# Patient Record
Sex: Male | Born: 1985 | Race: White | Hispanic: No | Smoking: Former smoker
Health system: Southern US, Academic
[De-identification: ages and names within clinical notes are randomized; demographics above are authoritative.]

## PROBLEM LIST (undated history)

## (undated) DIAGNOSIS — M5430 Sciatica, unspecified side: Secondary | ICD-10-CM

## (undated) DIAGNOSIS — R569 Unspecified convulsions: Secondary | ICD-10-CM

## (undated) DIAGNOSIS — F410 Panic disorder [episodic paroxysmal anxiety] without agoraphobia: Secondary | ICD-10-CM

## (undated) HISTORY — DX: Unspecified convulsions (CMS HCC): R56.9

## (undated) HISTORY — DX: Sciatica, unspecified side: M54.30

## (undated) HISTORY — PX: PILONIDAL CYST / SINUS EXCISION: SUR543

## (undated) HISTORY — DX: Panic disorder (episodic paroxysmal anxiety): F41.0

---
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
## 2022-11-12 ENCOUNTER — Other Ambulatory Visit: Payer: Self-pay

## 2022-11-12 ENCOUNTER — Observation Stay
Admission: EM | Admit: 2022-11-12 | Discharge: 2022-11-13 | Disposition: A | Discharge status: Home / Self Care | Payer: BC Managed Care – PPO | Attending: Critical Care Medicine | Admitting: Critical Care Medicine

## 2022-11-12 ENCOUNTER — Inpatient Hospital Stay (HOSPITAL_COMMUNITY): Payer: BC Managed Care – PPO

## 2022-11-12 DIAGNOSIS — G9340 Encephalopathy, unspecified: Secondary | ICD-10-CM

## 2022-11-12 DIAGNOSIS — T40601A Poisoning by unspecified narcotics, accidental (unintentional), initial encounter: Secondary | ICD-10-CM

## 2022-11-12 DIAGNOSIS — R569 Unspecified convulsions: Principal | ICD-10-CM | POA: Diagnosis present

## 2022-11-12 DIAGNOSIS — R4182 Altered mental status, unspecified: Secondary | ICD-10-CM

## 2022-11-12 DIAGNOSIS — R5383 Other fatigue: Secondary | ICD-10-CM

## 2022-11-12 DIAGNOSIS — R451 Restlessness and agitation: Secondary | ICD-10-CM

## 2022-11-12 DIAGNOSIS — G928 Other toxic encephalopathy: Secondary | ICD-10-CM | POA: Diagnosis present

## 2022-11-12 DIAGNOSIS — L989 Disorder of the skin and subcutaneous tissue, unspecified: Secondary | ICD-10-CM | POA: Diagnosis present

## 2022-11-12 DIAGNOSIS — R000 Tachycardia, unspecified: Secondary | ICD-10-CM | POA: Insufficient documentation

## 2022-11-12 DIAGNOSIS — F410 Panic disorder [episodic paroxysmal anxiety] without agoraphobia: Secondary | ICD-10-CM | POA: Diagnosis present

## 2022-11-12 DIAGNOSIS — F32A Depression, unspecified: Secondary | ICD-10-CM | POA: Diagnosis present

## 2022-11-12 LAB — URINALYSIS, MACROSCOPIC
BILIRUBIN: NEGATIVE mg/dL
BLOOD: NEGATIVE mg/dL
GLUCOSE: NEGATIVE mg/dL
KETONES: NEGATIVE mg/dL
LEUKOCYTES: NEGATIVE WBCs/uL
NITRITE: NEGATIVE
PH: 5.5 (ref 5.0–9.0)
PROTEIN: 70 mg/dL — AB
SPECIFIC GRAVITY: 1.027 (ref 1.002–1.030)
UROBILINOGEN: NORMAL mg/dL

## 2022-11-12 LAB — ARTERIAL BLOOD GAS/LACTATE
%FIO2 (ARTERIAL): 21 %
BASE EXCESS (ARTERIAL): 1.2 mmol/L (ref 0.0–2.0)
BICARBONATE (ARTERIAL): 25.5 mmol/L (ref 20.0–26.0)
CARBOXYHEMOGLOBIN: 0.9 % (ref <=1.5)
LACTATE: 1 mmol/L (ref <=2.0)
MET-HEMOGLOBIN: 0.3 % (ref <=2.0)
O2CT: 18.4 %
OXYHEMOGLOBIN: 95.3 % (ref 88.0–100.0)
PCO2 (ARTERIAL): 38 mm/Hg (ref 35–45)
PH (ARTERIAL): 7.44 (ref 7.35–7.45)
PO2 (ARTERIAL): 81 mm/Hg (ref 80–100)

## 2022-11-12 LAB — URINALYSIS, MICROSCOPIC
HYALINE CASTS: 10 /lpf — ABNORMAL HIGH (ref <0)
NON-SQUAMOUS EPITHELIAL CELLS URINE: <1 /hpf (ref <=1)
RBCS: 1 /hpf (ref <4)
SQUAMOUS EPITHELIAL: <1 /hpf (ref <28)
WBCS: 2 /hpf (ref <6)

## 2022-11-12 LAB — COMPREHENSIVE METABOLIC PANEL, NON-FASTING
ALBUMIN/GLOBULIN RATIO: 2 — ABNORMAL HIGH (ref 0.8–1.4)
ALBUMIN: 4.2 g/dL (ref 3.5–5.7)
ALKALINE PHOSPHATASE: 90 U/L (ref 34–104)
ALT (SGPT): 337 U/L — ABNORMAL HIGH (ref 7–52)
ANION GAP: 7 mmol/L (ref 4–13)
AST (SGOT): 216 U/L — ABNORMAL HIGH (ref 13–39)
BILIRUBIN TOTAL: 1 mg/dL (ref 0.3–1.2)
BUN/CREA RATIO: 14 (ref 6–22)
BUN: 17 mg/dL (ref 7–25)
CALCIUM, CORRECTED: 9.4 mg/dL (ref 8.9–10.8)
CALCIUM: 9.6 mg/dL (ref 8.6–10.3)
CHLORIDE: 108 mmol/L — ABNORMAL HIGH (ref 98–107)
CO2 TOTAL: 26 mmol/L (ref 21–31)
CREATININE: 1.21 mg/dL (ref 0.60–1.30)
ESTIMATED GFR: 79 mL/min/{1.73_m2} (ref >59)
GLOBULIN: 2.1 — ABNORMAL LOW (ref 2.9–5.4)
GLUCOSE: 135 mg/dL — ABNORMAL HIGH (ref 74–109)
OSMOLALITY, CALCULATED: 285 mOsm/kg (ref 270–290)
POTASSIUM: 3.9 mmol/L (ref 3.5–5.1)
PROTEIN TOTAL: 6.3 g/dL — ABNORMAL LOW (ref 6.4–8.9)
SODIUM: 141 mmol/L (ref 136–145)

## 2022-11-12 LAB — DRUG SCREEN, NO CONFIRMATION, URINE
AMPHET QL: NEGATIVE
BARB QL: NEGATIVE
BENZO QL: POSITIVE — AB
BUP QL: NEGATIVE
CANNAQL: NEGATIVE
COCQL: NEGATIVE
FENTANYL, RANDOM URINE: NEGATIVE
METHQL: NEGATIVE
OPIATE: NEGATIVE
OXYCODONE URINE: NEGATIVE
PCP QL: NEGATIVE

## 2022-11-12 LAB — TROPONIN-I: TROPONIN I: 33 ng/L — ABNORMAL HIGH (ref <20)

## 2022-11-12 LAB — CBC WITH DIFF
BASOPHIL #: 0.1 10*3/uL (ref 0.00–0.10)
BASOPHIL %: 1 % (ref 0–1)
EOSINOPHIL #: 0.1 10*3/uL (ref 0.00–0.50)
EOSINOPHIL %: 1 %
HCT: 39.2 % (ref 36.7–47.1)
HGB: 13.6 g/dL (ref 12.5–16.3)
LYMPHOCYTE #: 1.4 10*3/uL (ref 1.00–3.00)
LYMPHOCYTE %: 16 % (ref 16–44)
MCH: 31.4 pg (ref 23.8–33.4)
MCHC: 34.8 g/dL (ref 32.5–36.3)
MCV: 90.3 fL (ref 73.0–96.2)
MONOCYTE #: 0.8 10*3/uL (ref 0.30–1.00)
MONOCYTE %: 9 % (ref 5–13)
MPV: 7.6 fL (ref 7.4–11.4)
NEUTROPHIL #: 6.8 10*3/uL (ref 1.85–7.80)
NEUTROPHIL %: 74 % (ref 43–77)
PLATELETS: 193 10*3/uL (ref 140–440)
RBC: 4.34 10*6/uL (ref 4.06–5.63)
RDW: 14.2 % (ref 12.1–16.2)
WBC: 9.2 10*3/uL (ref 3.6–10.2)

## 2022-11-12 LAB — CREATINE KINASE (CK), TOTAL, SERUM OR PLASMA: CREATINE KINASE: 141 U/L (ref 30–223)

## 2022-11-12 LAB — LACTIC ACID LEVEL W/ REFLEX FOR LEVEL >2.0: LACTIC ACID: 2.1 mmol/L (ref 0.5–2.2)

## 2022-11-12 LAB — AMMONIA: AMMONIA: 13 umol/L — ABNORMAL LOW (ref 16–53)

## 2022-11-12 LAB — POC BLOOD GLUCOSE (RESULTS)
GLUCOSE, POC: 187 mg/dl — ABNORMAL HIGH (ref 70–100)
GLUCOSE, POC: 89 mg/dl (ref 70–100)

## 2022-11-12 LAB — ACETAMINOPHEN LEVEL: ACETAMINOPHEN LEVEL: <10 ug/mL — ABNORMAL LOW (ref 10–30)

## 2022-11-12 LAB — C-REACTIVE PROTEIN(CRP),INFLAMMATION: C-REACTIVE PROTEIN (CRP): 1.1 mg/dL — ABNORMAL HIGH (ref 0.1–0.5)

## 2022-11-12 LAB — ETHANOL, SERUM/PLASMA: ETHANOL: <10 mg/dL — ABNORMAL HIGH

## 2022-11-12 LAB — SALICYLATE ACID LEVEL: SALICYLATE LEVEL: <3 mg/dL (ref <=30)

## 2022-11-12 MED ORDER — HEPARIN (PORCINE) 5,000 UNIT/ML INJECTION SOLUTION
5000.0000 [IU] | Freq: Three times a day (TID) | INTRAMUSCULAR | Status: DC
Start: 2022-11-12 — End: 2022-11-13
  Administered 2022-11-12: 0 [IU] via SUBCUTANEOUS
  Administered 2022-11-12: 5000 [IU] via SUBCUTANEOUS
  Administered 2022-11-12 – 2022-11-13 (×2): 0 [IU] via SUBCUTANEOUS
  Filled 2022-11-12 (×3): qty 1

## 2022-11-12 MED ORDER — FENTANYL (PF) 50 MCG/ML INJECTION WRAPPER
INJECTION | INTRAMUSCULAR | Status: AC
Start: 2022-11-12 — End: 2022-11-12
  Filled 2022-11-12: qty 20

## 2022-11-12 MED ORDER — ONDANSETRON HCL (PF) 4 MG/2 ML INJECTION SOLUTION
4.0000 mg | Freq: Four times a day (QID) | INTRAMUSCULAR | Status: DC | PRN
Start: 2022-11-12 — End: 2022-11-13

## 2022-11-12 MED ORDER — SODIUM CHLORIDE 0.9 % INTRAVENOUS SOLUTION
0.5000 mg/h | INJECTION | INTRAVENOUS | Status: DC
Start: 2022-11-12 — End: 2022-11-12
  Administered 2022-11-12: 0.5 mg/h via INTRAVENOUS
  Administered 2022-11-12: 0 mg/h via INTRAVENOUS
  Administered 2022-11-12: 0.25 mg/h via INTRAVENOUS
  Administered 2022-11-12: 0.5 mg/h via INTRAVENOUS
  Administered 2022-11-12: 0 mg/h via INTRAVENOUS
  Filled 2022-11-12 (×8): qty 4

## 2022-11-12 MED ORDER — SODIUM CHLORIDE 0.9 % INTRAVENOUS SOLUTION
INTRAVENOUS | Status: DC
Start: 2022-11-12 — End: 2022-11-13

## 2022-11-12 MED ORDER — NALOXONE 1 MG/ML INJECTION SYRINGE
INJECTION | INTRAMUSCULAR | Status: AC
Start: 2022-11-12 — End: 2022-11-12
  Filled 2022-11-12: qty 2

## 2022-11-12 MED ORDER — SODIUM CHLORIDE 0.9 % (FLUSH) INJECTION SYRINGE
3.0000 mL | INJECTION | Freq: Three times a day (TID) | INTRAMUSCULAR | Status: DC
Start: 2022-11-12 — End: 2022-11-13
  Administered 2022-11-12 (×3): 3 mL
  Administered 2022-11-13: 0 mL

## 2022-11-12 MED ORDER — SODIUM CHLORIDE 0.9 % INTRAVENOUS SOLUTION
0.2500 mg/h | INJECTION | INTRAVENOUS | Status: DC
Start: 2022-11-12 — End: 2022-11-12
  Filled 2022-11-12 (×2): qty 4

## 2022-11-12 MED ORDER — SODIUM CHLORIDE 0.9 % (FLUSH) INJECTION SYRINGE
3.0000 mL | INJECTION | INTRAMUSCULAR | Status: DC | PRN
Start: 2022-11-12 — End: 2022-11-13

## 2022-11-12 MED ORDER — ETHYL ALCOHOL 62 % (NOZIN NASAL SANITIZER) NASAL SOLUTION - BULK BOTTLE
1.0000 | Freq: Two times a day (BID) | NASAL | Status: DC
Start: 2022-11-12 — End: 2022-11-13
  Administered 2022-11-12 – 2022-11-13 (×3): 1 via NASAL

## 2022-11-12 MED ORDER — SODIUM CHLORIDE 0.9 % INTRAVENOUS SOLUTION
0.2500 mg/h | INJECTION | INTRAVENOUS | Status: DC
Start: 2022-11-12 — End: 2022-11-12
  Administered 2022-11-12: 0 mg/h via INTRAVENOUS
  Administered 2022-11-12: 0.25 mg/h via INTRAVENOUS
  Filled 2022-11-12: qty 4

## 2022-11-12 MED ORDER — HYDROXYZINE PAMOATE 25 MG CAPSULE
25.0000 mg | ORAL_CAPSULE | Freq: Three times a day (TID) | ORAL | Status: DC | PRN
Start: 2022-11-12 — End: 2022-11-13
  Administered 2022-11-12 – 2022-11-13 (×2): 25 mg via ORAL
  Filled 2022-11-12 (×2): qty 1

## 2022-11-12 MED ORDER — ACETAMINOPHEN 325 MG TABLET
650.0000 mg | ORAL_TABLET | ORAL | Status: DC | PRN
Start: 2022-11-12 — End: 2022-11-13

## 2022-11-12 MED ORDER — NALOXONE 1 MG/ML INJECTION SYRINGE
2.0000 mg | INJECTION | INTRAMUSCULAR | Status: AC
Start: 2022-11-12 — End: 2022-11-12
  Administered 2022-11-12: 2 mg via INTRAVENOUS

## 2022-11-12 MED ORDER — HYDROXYZINE PAMOATE 50 MG CAPSULE
50.0000 mg | ORAL_CAPSULE | Freq: Once | ORAL | Status: AC
Start: 2022-11-12 — End: 2022-11-12
  Administered 2022-11-12: 50 mg via ORAL
  Filled 2022-11-12: qty 1

## 2022-11-12 NOTE — Nurses Notes (Signed)
Received to ICU 1 from the Emergency Department.  Transferred to unit bed and connected to monitoring equipment.  Patient lethargic but arouses to verbal/tactile stimulus.  Falls back to sleep quickly without continued stimulation.  Narcan infusing at 0.3m/hr.  ABennie Pierini NP at bedside.  Informed of decreased respirations ranging 8 to 10 BMP.  Order given to increase drip rate to 0.5 mg/hr.  See EMR for full physical assessment.

## 2022-11-12 NOTE — Discharge Summary (Addendum)
[facility]  DISCHARGE SUMMARY    PATIENT NAME:  Clayton Chandler, Clayton Chandler  MRN:  [id number]  DOB:  29-Jul-1986    ENCOUNTER DATE:  11/12/2022  INPATIENT ADMISSION DATE: 11/12/2022  DISCHARGE DATE:  11/13/22    ATTENDING PHYSICIAN: Alvina Filbert, MD  SERVICE: PRN HOSPITALIST INTENSIVIST  PRIMARY CARE PHYSICIAN: No Pcp       No lay caregiver identified.    PRIMARY DISCHARGE DIAGNOSIS: Seizure-like activity (CMS [facility])  Active Hospital Problems    Diagnosis Date Noted    Principal Problem: Seizure-like activity (CMS [place]) [R56.9] Q000111Q    Toxic metabolic encephalopathy Q000111Q 11/12/2022    Seizure (CMS [place]) [R56.9] 11/12/2022      Resolved Hospital Problems   No resolved problems to display.     There are no active non-hospital problems to display for this patient.          Current Discharge Medication List      You have not been prescribed any medications.       Discharge med list refreshed?  NO     Allergies   Allergen Reactions    Influenza A (H5n1) Virus Vaccine Monoval (18 Yr +)  Other Adverse Reaction (Add comment)     UNKNOWN     HOSPITAL PROCEDURE(S):   No orders of the defined types were placed in this encounter.      [place]     Pt was admitted for AMS thought to be r/t seizure activity. He was given Versed by EMS per ER record. Pt was lethargic and had slowed respirations and required administration of narcan with improvement of symptoms. Symptoms were recurrent and he was admitted to ICU with narcan gtt. Seizure precautions were in place, however, no further seizure activity was observed. Pt was seen and examined during rounds this am by the icu physician. He had stable vs and mentation had improved so narcan gtt was DC. He has been alert and oriented consistently throughout the day. He has been cooperative with care. EEG was neg for seizures but had generalized abnormalities. We were  called to bedside this afternoon for pt requesting to leave AMA. Pt  appeared alert and oriented x 3, NAD. He states he has panic disorder and needs to sign himself out. Risks of leaving AMA were explained to pt at bedside in detail including the possibility of worsening of his condition up to and including death or serious injury. Pt appears to have sufficient insight to his condition to make his own decisions. He denies SI/HI. Pt was made aware that he absolutely should not drive or operate heavy machinery until cleared to do so by his physician. Pt states he has been taking an OTC medication available at gas stations called Phenibut that he thinks led to his symptoms. Pt was advised to avoid recreational substance ingestion. Pt verbalized understanding. At this time, he is awaiting a ride home with his spouse.     Please refer to chart for complete details.     The Hospitalist personally evaluated and examined the patient in conjunction with the MLP and agree with the assessments, treatment plan and disposition of the patient as recorded by the [facility].     ADDENDUM:11/13/22  Pt decided to remain at [facility] for ongoing monitoring as recommended. He was given vistaril tid prn for anxiety which was effective and well tolerated. No further ams or seizure activity was observed. Pt continues to deny SI/HI. VSS. He has  no new complaints. No overnight events per nursing. He was seen and examined by the ICU physician and felt to be stable for DC. Pt requests referral to psychiatry for management of anxiety/depression as he states he intends to abstain from self medication with OTC agents. Have asked case management to assist with referral process as pt lives in [place] and would benefit from a provider near his home. Will refer outpt to neurology as well and have instructed pt not to drive or operate heavy machinery until cleared to do so by neurology. Will DC with short supply of prn vistaril for anxiety until he is able to see psychiatrist. Please refer to chart for complete details.      PHYSCIAL EXAM 11/13/22  BP (!) 140/92   Pulse 99   Temp 36.6 C (97.9 F)   Resp 12   Ht 1.727 m (5' 8"$ )   Wt 83.2 kg (183 lb 6.4 oz)   SpO2 97%   BMI 27.89 kg/m    Pt examined by icu physician  Alert/oriented x 3, NAD, normal affect  HRRR  Lungs cta x 2, normal effort  Abdomen soft/active/nontender x 4, no guarding  Skin warm/dry  No leg edema  No focal deficits    The Hospitalist personally evaluated and examined the patient in conjunction with the MLP and agree with the assessments, treatment plan and disposition of the patient as recorded by the MLP.   LINES/DRAINS/WOUNDS AT DISCHARGE:   Patient Lines/Drains/Airways Status       Active Line / Dialysis Catheter / Dialysis Graft / Drain / Airway / Wound       Name Placement date Placement time Site Days    Peripheral IV Left Median Cubital  (antecubital fossa) 11/12/22  0155  -- less than 1                    DISCHARGE DISPOSITION:  home  DISCHARGE INSTRUCTIONS:    No discharge procedures on file.       Sherol Dade, PA-C    Copies sent to Care Team         Relationship Specialty Notifications Start End    Pcp, No PCP - General   11/12/22             Referring providers can utilize [email or web address] to access their referred [place] patient's information.

## 2022-11-12 NOTE — H&P (Addendum)
[facility]  Admission H&P    Date of Service:  11/12/2022  Zackarey, Kienle, 37 y.o. male  Encounter Start Date:  11/12/2022  Inpatient Admission Date:   Date of Birth:  28-Feb-1986  PCP: No Pcp      Chief Complaint:  Altered mental status    HPI: Clayton Chandler is a 37 y.o., White male who presents to the emergency room by EMS for altered mental status  Per ER physician patient was at work and coworkers thought that he was altered so EMS was called  During the 1st visit to the work site patient refused transport, stating that his blood sugar was low and he had eaten something and felt better  Apparently was answering all questions appropriately at that time  He went back to work and coworkers called EMS a 2nd time because he was again altered and possibly had seizure activity  There was no incontinence of bowel or bladder control  EMS reported that patient was jerking and would not hold still for them, agitated and combative and thus was given Versed 2 mg IV  On arrival to the emergency room patient was completely obtunded with respirations of 5, no gag reflex and O2 sats 90% on room air  He was given Narcan initially with some improvement in his mental status  However he began to get altered again and ER physician has ordered a Narcan drip which he had not been started yet during my exam and I was consulted for admission  During my exam patient is very lethargic, but would awaken briefly and answer questions.  He is protecting his airway  He knows he is at the hospital and names the year correctly but would not name the president  And  he had to be stimulated with each question to get him to awaken enough to answer  He denies any complaints of pain  Specifically denies any head chest or abdominal pain  He denied any drug use to the ER staff however would not answer that question directly for me    Assessment/plan:    ** ACUTE ENCEPHALOPATHY, LIKELY TOXIC  ** ALTERED MENTAL STATUS  ** POSSIBLE  SEIZURE  ** MULTIPLE SCABBED OVER SKIN LESIONS, LIKELY METH RELATED  -admit to ICU with telemetry  -hourly neuro checks  -Narcan drip  -I asked ER to obtain CT head which is pending at the time of this dictation    BMP:   141 (02/13 0229) 108* (02/13 0229) 17 (02/13 0229)    /     135* (02/13 0229)   3.9 (02/13 0229) 26 (02/13 0229) 1.21 (02/13 0229) \          CBC:     9.2 (02/13 0229) \   13.6 (02/13 0229) /   193 (02/13 0229)      / 39.2 (02/13 0229) \       ABG 7.44/38/81/25  ACETAMINOPHEN, SALICYLATE, ALCOHOL LEVEL IS NEGATIVE  UDS POSITIVE FOR BENZOS    CT head pending        Review of Systems:  ROS as above otherwise not reliable    Examination:  Temperature: 36.6 C (97.9 F) Heart Rate: 100 BP (Non-Invasive): 109/69   Respiratory Rate: (!) 8 SpO2: 94 %     Physical Exam General well-developed well-nourished disheveled white male  No acute distress, sitting up in bed  HEENT normocephalic he does have abrasions noted over scalp and right forehead negative for coons eyes  or battle signs, no blood or fluid noted the nose or ears  Neck supple  Chest equal expansion  Lungs clear  Heart regular rate and rhythm  Abdomen soft nontender nondistended no guarding or rebound  Extremities no edema  Neuro very lethargic as stated above, moves all 4 extremities but otherwise would not cooperate with a formal neurological exam  Skin warm dry positive for multiple scabbed over lesions essentially all over his body            PAST MEDICAL:    No past medical history on file.  Unknown   Unknown         Medications Prior to Admission       None          Allergies   Allergen Reactions    Influenza A (H5n1) Virus Vaccine Monoval (18 Yr +)  Other Adverse Reaction (Add comment)     UNKNOWN       Family History:  Family Medical History:    None            Social History:      Labs:    I have reviewed all lab results.  Lab Results Today:    Results for orders placed or performed during the hospital encounter of 11/12/22 (from the  past 24 hour(s))   POC BLOOD GLUCOSE (RESULTS)   Result Value Ref Range    GLUCOSE, POC 187 (H) 70 - 100 mg/dl   ARTERIAL BLOOD GAS/LACTATE   Result Value Ref Range    PH (ARTERIAL) 7.44 7.35 - 7.45    PCO2 (ARTERIAL) 38 35 - 45 mm/Hg    BICARBONATE (ARTERIAL) 25.5 20.0 - 26.0 mmol/L    BASE EXCESS (ARTERIAL) 1.2 0.0 - 2.0 mmol/L    MET-HEMOGLOBIN 0.3 <=2.0 %    LACTATE 1.0 <=2.0 mmol/L    CARBOXYHEMOGLOBIN 0.9 <=1.5 %    O2CT 18.4 %    %FIO2 (ARTERIAL) 21 %    PO2 (ARTERIAL) 81 80 - 100 mm/Hg    OXYHEMOGLOBIN 95.3 88.0 - 100.0 %    ALLEN TEST Pass     DRAW SITE RRad    C-REACTIVE PROTEIN(CRP),INFLAMMATION   Result Value Ref Range    C-REACTIVE PROTEIN (CRP) 1.1 (H) 0.1 - 0.5 mg/dL   COMPREHENSIVE METABOLIC PANEL, NON-FASTING   Result Value Ref Range    SODIUM 141 136 - 145 mmol/L    POTASSIUM 3.9 3.5 - 5.1 mmol/L    CHLORIDE 108 (H) 98 - 107 mmol/L    CO2 TOTAL 26 21 - 31 mmol/L    ANION GAP 7 4 - 13 mmol/L    BUN 17 7 - 25 mg/dL    CREATININE 1.21 0.60 - 1.30 mg/dL    BUN/CREA RATIO 14 6 - 22    ESTIMATED GFR 79 >59 mL/min/1.56m2    ALBUMIN 4.2 3.5 - 5.7 g/dL    CALCIUM 9.6 8.6 - 10.3 mg/dL    GLUCOSE 135 (H) 74 - 109 mg/dL    ALKALINE PHOSPHATASE 90 34 - 104 U/L    ALT (SGPT) 337 (H) 7 - 52 U/L    AST (SGOT) 216 (H) 13 - 39 U/L    BILIRUBIN TOTAL 1.0 0.3 - 1.2 mg/dL    PROTEIN TOTAL 6.3 (L) 6.4 - 8.9 g/dL    ALBUMIN/GLOBULIN RATIO 2.0 (H) 0.8 - 1.4    OSMOLALITY, CALCULATED 285 270 - 290 mOsm/kg    CALCIUM, CORRECTED 9.4 8.9 - 10.8 mg/dL  GLOBULIN 2.1 (L) 2.9 - 5.4   LACTIC ACID LEVEL W/ REFLEX FOR LEVEL >2.0   Result Value Ref Range    LACTIC ACID 2.1 0.5 - 2.2 mmol/L   TROPONIN-I   Result Value Ref Range    TROPONIN I 33 (H) <20 ng/L   ACETAMINOPHEN LEVEL   Result Value Ref Range    ACETAMINOPHEN LEVEL <10 (L) 10 - 30 ug/mL   ETHANOL, SERUM   Result Value Ref Range    ETHANOL <10 (H) 0 mg/dL   SALICYLATE ACID LEVEL   Result Value Ref Range    SALICYLATE LEVEL <3 [id number] mg/dL   CBC WITH DIFF   Result Value Ref  Range    WBC 9.2 3.6 - 10.2 x10^3/uL    RBC 4.34 4.06 - 5.63 x10^6/uL    HGB 13.6 12.5 - 16.3 g/dL    HCT 39.2 36.7 - 47.1 %    MCV 90.3 73.0 - 96.2 fL    MCH 31.4 23.8 - 33.4 pg    MCHC 34.8 32.5 - 36.3 g/dL    RDW 14.2 12.1 - 16.2 %    PLATELETS 193 140 - 440 x10^3/uL    MPV 7.6 7.4 - 11.4 fL    NEUTROPHIL % 74 43 - 77 %    LYMPHOCYTE % 16 16 - 44 %    MONOCYTE % 9 5 - 13 %    EOSINOPHIL % 1 %    BASOPHIL % 1 0 - 1 %    NEUTROPHIL # 6.80 1.85 - 7.80 x10^3/uL    LYMPHOCYTE # 1.40 1.00 - 3.00 x10^3/uL    MONOCYTE # 0.80 0.30 - 1.00 x10^3/uL    EOSINOPHIL # 0.10 0.00 - 0.50 x10^3/uL    BASOPHIL # 0.10 0.00 - 0.10 x10^3/uL   DRUG SCREEN, NO CONFIRMATION, URINE   Result Value Ref Range    AMPHET QL Negative Negative    BARB QL Negative Negative    BENZO QL Positive (A) Negative    BUP QL Negative Negative    CANNAQL Negative Negative    COCQL Negative Negative    FENTANYL, RANDOM URINE Negative Negative    OPIATE Negative Negative    OXYCODONE URINE Negative Negative    PCP QL Negative Negative    METHQL Negative Negative   URINALYSIS, MACROSCOPIC   Result Value Ref Range    COLOR Yellow Colorless, Light Yellow, Yellow    APPEARANCE Clear Clear    SPECIFIC GRAVITY 1.027 1.002 - 1.030    PH 5.5 5.0 - 9.0    LEUKOCYTES Negative Negative, 100  WBCs/uL    NITRITE Negative Negative    PROTEIN 70 (A) Negative, 10 , 20  mg/dL    GLUCOSE Negative Negative, 30  mg/dL    KETONES Negative Negative, Trace mg/dL    BILIRUBIN Negative Negative, 0.5 mg/dL    BLOOD Negative Negative, 0.03 mg/dL    UROBILINOGEN Normal Normal mg/dL   URINALYSIS, MICROSCOPIC   Result Value Ref Range    MUCOUS Rare (A) (none) /hpf    BUDDING YEAST Occasional (A) (none) /hpf    RBCS 1 <4 /hpf    WBCS 2 <6 /hpf    HYALINE CASTS 10 (H) <0 /lpf    SQUAMOUS EPITHELIAL <1 <28 /hpf    NON-SQUAMOUS EPITHELIAL CELLS URINE <1 <=1 /hpf       Imaging Studies:    No results found.  No orders to display        DNR Status:  No Order    Assessment/Plan:   There are no  active hospital problems to display for this patient.            DVT/PE Prophylaxis: SCDs/ Venodynes/Impulse boots    Felecia Jan MD

## 2022-11-12 NOTE — ED Provider Notes (Signed)
[facility]  Emergency Department  Attending Provider Note      CHIEF COMPLAINT  Chief Complaint   Patient presents with    Altered Mental Status     HISTORY OF PRESENT ILLNESS  Clayton Chandler, date of birth [birth year], is a 37 y.o. male who presented to the Emergency Department via EMS due to altered mental status.  EMS reports the patient was at work tonight when he became altered.  EMS states they were called out earlier and the patient was able to refused.  They state they were called a 2nd time a short time later and the patient was altered and could not refused.  EMS administered Versed 2 mg IV and the patient became deeply unresponsive.  Upon arrival to the ED, the patient had 5 respirations per minute and his oxygen saturation was 90% on room air.    PAST MEDICAL/SURGICAL/FAMILY/SOCIAL HISTORY  No past medical history on file.    Family Medical History:    None       Social History     Socioeconomic History    Marital status: Unknown      ALLERGIES  Allergies   Allergen Reactions    Influenza A (H5n1) Virus Vaccine Monoval (18 Yr +)  Other Adverse Reaction (Add comment)     UNKNOWN       PHYSICAL EXAM  VITAL SIGNS:  Filed Vitals:    11/12/22 0157   BP: 109/69   Pulse: 100   Resp: (!) 8   Temp: 36.6 C (97.9 F)   SpO2: 94%     GENERAL: PATIENT IS unresponsive  HEAD: NORMOCEPHALIC AND ATRAUMATIC.  EYES: PUPILS pinpoint  CARDIOVASCULAR:  Tachycardia. NO MURMUR.  LUNGS: CLEAR TO AUSCULTATION BILATERAL.  ABDOMEN: SOFT, NON-TENDER, NON-DISTENDED, AND BOWEL SOUNDS ARE PRESENT.  GENITOURINARY: DEFERRED.  RECTAL: DEFERRED.  EXTREMITIES: NO CYANOSIS, CLUBBING, OR EDEMA.  SKIN: WARM AND DRY.    DIAGNOSTICS  Labs:  Labs listed below were reviewed and interpreted by me.  Results for orders placed or performed during the hospital encounter of 11/12/22   C-REACTIVE PROTEIN(CRP),INFLAMMATION   Result Value Ref Range    C-REACTIVE PROTEIN (CRP) 1.1 (H) 0.1 - 0.5 mg/dL   COMPREHENSIVE METABOLIC PANEL,  NON-FASTING   Result Value Ref Range    SODIUM 141 136 - 145 mmol/L    POTASSIUM 3.9 3.5 - 5.1 mmol/L    CHLORIDE 108 (H) 98 - 107 mmol/L    CO2 TOTAL 26 21 - 31 mmol/L    ANION GAP 7 4 - 13 mmol/L    BUN 17 7 - 25 mg/dL    CREATININE 1.21 0.60 - 1.30 mg/dL    BUN/CREA RATIO 14 6 - 22    ESTIMATED GFR 79 >59 mL/min/1.53m2    ALBUMIN 4.2 3.5 - 5.7 g/dL    CALCIUM 9.6 8.6 - 10.3 mg/dL    GLUCOSE 135 (H) 74 - 109 mg/dL    ALKALINE PHOSPHATASE 90 34 - 104 U/L    ALT (SGPT) 337 (H) 7 - 52 U/L    AST (SGOT) 216 (H) 13 - 39 U/L    BILIRUBIN TOTAL 1.0 0.3 - 1.2 mg/dL    PROTEIN TOTAL 6.3 (L) 6.4 - 8.9 g/dL    ALBUMIN/GLOBULIN RATIO 2.0 (H) 0.8 - 1.4    OSMOLALITY, CALCULATED 285 270 - 290 mOsm/kg    CALCIUM, CORRECTED 9.4 8.9 - 10.8 mg/dL    GLOBULIN 2.1 (L) 2.9 - 5.4   LACTIC ACID LEVEL  W/ REFLEX FOR LEVEL >2.0   Result Value Ref Range    LACTIC ACID 2.1 0.5 - 2.2 mmol/L   TROPONIN-I   Result Value Ref Range    TROPONIN I 33 (H) <20 ng/L   ACETAMINOPHEN LEVEL   Result Value Ref Range    ACETAMINOPHEN LEVEL <10 (L) 10 - 30 ug/mL   ETHANOL, SERUM   Result Value Ref Range    ETHANOL <10 (H) 0 mg/dL   SALICYLATE ACID LEVEL   Result Value Ref Range    SALICYLATE LEVEL <3 [id number] mg/dL   DRUG SCREEN, NO CONFIRMATION, URINE   Result Value Ref Range    AMPHET QL Negative Negative    BARB QL Negative Negative    BENZO QL Positive (A) Negative    BUP QL Negative Negative    CANNAQL Negative Negative    COCQL Negative Negative    FENTANYL, RANDOM URINE Negative Negative    OPIATE Negative Negative    OXYCODONE URINE Negative Negative    PCP QL Negative Negative    METHQL Negative Negative   ARTERIAL BLOOD GAS/LACTATE   Result Value Ref Range    PH (ARTERIAL) 7.44 7.35 - 7.45    PCO2 (ARTERIAL) 38 35 - 45 mm/Hg    BICARBONATE (ARTERIAL) 25.5 20.0 - 26.0 mmol/L    BASE EXCESS (ARTERIAL) 1.2 0.0 - 2.0 mmol/L    MET-HEMOGLOBIN 0.3 <=2.0 %    LACTATE 1.0 <=2.0 mmol/L    CARBOXYHEMOGLOBIN 0.9 <=1.5 %    O2CT 18.4 %    %FIO2 (ARTERIAL) 21 %     PO2 (ARTERIAL) 81 80 - 100 mm/Hg    OXYHEMOGLOBIN 95.3 88.0 - 100.0 %    ALLEN TEST Pass     DRAW SITE RRad    CBC WITH DIFF   Result Value Ref Range    WBC 9.2 3.6 - 10.2 x10^3/uL    RBC 4.34 4.06 - 5.63 x10^6/uL    HGB 13.6 12.5 - 16.3 g/dL    HCT 39.2 36.7 - 47.1 %    MCV 90.3 73.0 - 96.2 fL    MCH 31.4 23.8 - 33.4 pg    MCHC 34.8 32.5 - 36.3 g/dL    RDW 14.2 12.1 - 16.2 %    PLATELETS 193 140 - 440 x10^3/uL    MPV 7.6 7.4 - 11.4 fL    NEUTROPHIL % 74 43 - 77 %    LYMPHOCYTE % 16 16 - 44 %    MONOCYTE % 9 5 - 13 %    EOSINOPHIL % 1 %    BASOPHIL % 1 0 - 1 %    NEUTROPHIL # 6.80 1.85 - 7.80 x10^3/uL    LYMPHOCYTE # 1.40 1.00 - 3.00 x10^3/uL    MONOCYTE # 0.80 0.30 - 1.00 x10^3/uL    EOSINOPHIL # 0.10 0.00 - 0.50 x10^3/uL    BASOPHIL # 0.10 0.00 - 0.10 x10^3/uL   URINALYSIS, MACROSCOPIC   Result Value Ref Range    COLOR Yellow Colorless, Light Yellow, Yellow    APPEARANCE Clear Clear    SPECIFIC GRAVITY 1.027 1.002 - 1.030    PH 5.5 5.0 - 9.0    LEUKOCYTES Negative Negative, 100  WBCs/uL    NITRITE Negative Negative    PROTEIN 70 (A) Negative, 10 , 20  mg/dL    GLUCOSE Negative Negative, 30  mg/dL    KETONES Negative Negative, Trace mg/dL    BILIRUBIN Negative Negative, 0.5 mg/dL    BLOOD  Negative Negative, 0.03 mg/dL    UROBILINOGEN Normal Normal mg/dL   URINALYSIS, MICROSCOPIC   Result Value Ref Range    MUCOUS Rare (A) (none) /hpf    BUDDING YEAST Occasional (A) (none) /hpf    RBCS 1 <4 /hpf    WBCS 2 <6 /hpf    HYALINE CASTS 10 (H) <0 /lpf    SQUAMOUS EPITHELIAL <1 <28 /hpf    NON-SQUAMOUS EPITHELIAL CELLS URINE <1 <=1 /hpf   POC BLOOD GLUCOSE (RESULTS)   Result Value Ref Range    GLUCOSE, POC 187 (H) 70 - 100 mg/dl     Radiology:       ED COURSE/MEDICAL DECISION MAKING  Medications Administered in the ED   naloxone (NARCAN) 4 mg in NS 250 mL infusion (has no administration in time range)   naloxone (NARCAN) 1 mg/mL injection (2 mg Intravenous Given 11/12/22 0204)          Medical Decision Making  The patient  presents to the ED via EMS.  The patient was unresponsive upon arrival.  The patient had pinpoint pupils and no gag reflex.  The patient was breathing 5 times per minute with a room air sat of 90%.  The patient was given Narcan 2 mg IVP in the ED with good results.  Labs were obtained that reveal a very mildly elevated troponin otherwise labs unremarkable.  After the initial Narcan administration the patient once again became sedated.  A 2nd dose of 2 mg of Narcan was administered with good results.  The patient again became lethargic a few minutes later and the Narcan drip was initiated.  The case was discussed with the hospitalist who will admit.  He did request a CT of the head which has been ordered.      CRITICAL CARE  This patient was treated critically for 30 minutes. This includes evaluation of vital signs, direct patient contact, evaluating the need for and adjusting medication/dosing. This also included respiratory monitoring, and management. Also included is discussion with other providers, providing documentation/charting, review of the medical record, and observation of the patient while in the emergency department. This time does not include procedures. The time is an approximation of the minimum time spent caring for the patient.    This patient experienced a situation that is threatening to life if gone untreated, this includes opiate overdose and opiate induced hypoventilation. Interventions included multiple naloxone administrations, naloxone drip, discussion with hospitalist, admission to ICU.     CLINICAL IMPRESSION  Clinical Impression   Opiate overdose, accidental or unintentional, initial encounter (CMS [place]) (Primary)     DISPOSITION  Admitted       DISCHARGE MEDICATIONS  There are no discharge medications for this patient.      Quita Skye Sabra Heck D.O.   11/12/2022, 02:22   [facility]  Department of Emergency Medicine  [facility]    Contents of the document, in whole  or in part, are completed utilizing M*Modal dictation technology, please forgive any typographical errors that may exist.   -----

## 2022-11-12 NOTE — Care Plan (Signed)
Admit to ICU with suspected seizure activity/overdose.  Narcan infusing per protocol.  Patient remains lethargic, arouses to verbal/tactile stimulation.  Seizure precautions in place.  See EMR for vitals/physical assessment.      Problem: Adult Inpatient Plan of Care  Goal: Patient-Specific Goal (Individualized)  Outcome: Ongoing (see interventions/notes)  Flowsheets (Taken 11/12/2022 0500)  Individualized Care Needs: Monitor for changes in mental status and seizure activity  Anxieties, Fears or Concerns: Unable to assess d/t altered mental status  Patient-Specific Goals (Include Timeframe): Improvement in mental status as soon as possible.  Maintain vital signs WNL.

## 2022-11-12 NOTE — ED Triage Notes (Signed)
EMS CALLED FOR ALTERED LOC UPON EMS ARRIVAL PATIENT WAS ALERT AND ORIENTED TOLD EMS His SUGAR DROPPED AND He WAS FINE AND SIGNED REFUSAL. APPROX 10 MIN LATER EMS CALLED FOR ALTERED METAL STATUS AGAIN. UPON EMS ARRIVAL COWORKERS REPORT PATIENT HAD SEIZURE LIKE ACTIVITY AND FELL INTO His MACHINE. EMS STATES PATIENT WAS JERKING ON THEM WOULDN'T HOLD STILL EMS MEDICATED WITH 2MG VERSED IV AND PATIENT NOW UNRESPONSIVE UPON ARRIVAL TO ER.   EMS - 18G SL L HAND, 2MG VERSED IV, EKG

## 2022-11-12 NOTE — Nurses Notes (Signed)
Narcan infusing at 0.5 mg/hr.  Respirations ranging 10 to 12 breaths/min.  No change in mental status.

## 2022-11-12 NOTE — Nurses Notes (Signed)
Narcan drip turned off per Dr. Flavia Shipper. Patient sitting up in bed. Slow to answer questions but will hold appropriate conversation. States he doesn't remember what happened and able to state that he is in a hospital now.

## 2022-11-12 NOTE — Procedures (Signed)
ELECTROENCEPHALOGRAM REPORT    Test Date:  November 12, 2022    Interpretation Date:  November 12, 2022    Location:  [facility]    Patient Referred By: Berkley Harvey, MD    EEG Number:  [id number]      INDICATIONS FOR PROCEDURE:  He has a 37 year old man who was admitted for altered mental status.  Electrodiagnostic testing ordered for concern of possible seizure activity prior to arrival.    Medications:   acetaminophen (TYLENOL) tablet, 650 mg, Oral, Q4H PRN  alcohol 62 % (NOZIN NASAL SANITIZER) nasal solution, 1 Each, Each Nostril, 2x/day  heparin 5,000 unit/mL injection, 5,000 Units, Subcutaneous, Q8HRS  naloxone (NARCAN) 4 mg in NS 250 mL infusion, 0.25 mg/hr, Intravenous, Continuous  NS flush syringe, 3 mL, Intracatheter, Q8HRS  NS flush syringe, 3 mL, Intracatheter, Q1H PRN  NS premix infusion, , Intravenous, Continuous  ondansetron (ZOFRAN) 2 mg/mL injection, 4 mg, Intravenous, Q6H PRN        DESCRIPTION OF PROCEDURE: Electrodes were applied using paste technique in positions dictated by the International 10-20 system of placement. Recording montages included both referential and bipolar derivations. In addition to EEG data, EKG was recorded.    This is a routine EEG recorded on November 12, 2022, running from 08:32:51 until 09:07:22 for 30 minutes and 15 seconds of data.    DESCRIPTION OF ACTIVITIES: At the onset of the recording, the patient is unresponsive with eyes closed on the stretcher.  There is no clear posterior dominant rhythm.  There is no anterior to posterior gradient throughout most of the recording.  There is profound generalized slowing.  There are intermittent generalized discharges of triphasic morphology.    No clear sleep architecture is seen.    Photic stimulation does not produce a driving response or epileptiform activation. Hyperventilation is not performed.    No epileptiform discharges or seizures are seen.    A single channel EKG shows normal sinus  rhythm.    CLINICAL INTERPRETATION: This routine length EEG with video is abnormal due to disorganization of the background, generalized slowing and intermittent discharges of triphasic morphology.  This is indicative of moderate to severe bihemispheric dysfunction. While nonspecific, triphasic waves are commonly seen in hepatic encephalopathy. No epileptiform discharges or seizures are seen.  Clinical correlation is advised.    Adrian Prows, DO  Assistant Professor of Neurology  [facility]

## 2022-11-12 NOTE — Nurses Notes (Signed)
Patient unable to verify home medication list d/t altered mental status.  No contact information on chart at this time.  Will notify oncoming shift of need to verify medications.

## 2022-11-12 NOTE — Nurses Notes (Signed)
Dr. Flavia Shipper at bedside. Decreased narcan drip to 0.25 mg/hr per verbal order. Dr. Flavia Shipper made aware of conversation with patient's wife this morning regarding "Zavia Silver". EEG continues at bedside.

## 2022-11-12 NOTE — Care Management Notes (Signed)
[facility] Management Initial Evaluation    Patient Name: Clayton Chandler  Date of Birth: 1985-12-01  Sex: male  Date/Time of Admission: 11/12/2022  1:53 AM  Room/Bed: ICU01/A  Payor: DEPT OF MEDICAL [employer] [place] / Plan: [place] MEDICAID / Product Type: Medicaid Out of State /   Primary Care Providers:  Pcp, No (General)    Pharmacy Info:   Preferred Pharmacy       None          Emergency Contact Info:   No emergency contact information on file.    History:   Clayton Chandler is a 37 y.o., male, admitted 11/12/22    Height/Weight: 172.7 cm (5' 8"$ ) / 83.6 kg (184 lb 4.9 oz)     LOS: 0 days   Admitting Diagnosis: Seizure (CMS [facility]) [R56.9]    Assessment:    11/12/22 1130   Assessment Details   Assessment Type Admission   Date of Care Management Update 11/12/22   Readmission   Is this a readmission? No   Insurance Information/Type   Insurance type Other (see comments)   Comment (Other Insurance): Dept of Medical Assistance [place]   Employment/Financial   Patient has Prescription Coverage?  Yes        Name of Insurance Coverage for Medications Dept of [place] Concerns none   Living Environment   Lives With spouse   Living Arrangements house   Able to Return to Prior Arrangements yes   Living Arrangement Comments Pt lives with wife   Home Safety   Home Assessment: No Problems Identified   Home Accessibility stairs to enter home   Custody and Legal Status   Do you have a court appointed guardian/conservator? No   Are you an emancipated minor? No   Custody Issues? No   Paternity Affidavit Requested? No   Care Management Plan   Discharge Planning Status initial meeting   Projected Discharge Date 11/17/22   Discharge plan discussed with: Spouse   CM will evaluate for rehabilitation potential no   Patient choice offered to patient/family no   Form for patient choice reviewed/signed and on chart no   Discharge Needs Assessment   Equipment Currently Used at Home none   Equipment  Needed After Discharge none   Discharge Facility/Level of Care Needs Home (Patient/Family Member/other)(code 1)   Transportation Available family or friend will provide;car   Referral Information   Admission Type inpatient   Address Verified verified-no changes   Arrived From home or self-care   ADVANCE DIRECTIVES   Does the Patient have an Advance Directive? Yes, Patient Does Have Advance Directive for Healthcare Treatment   Type of Advance Directive Completed *Appointed Health Care Surrogate   Copy of Advance Directives in Chart? Yes, Copy on Chart.(Specify in [place] Directive)   Name of [place]   Phone Number of MPOA or Healthcare Surrogate [phone]           Discharge Plan:  Home (Patient/Family Member/other) (code 1)    Initial CM assessment completed.  Pt is confused.  Trigger received for HCS.  Wife of pt Clayton Chandler [phone] has agreed to be HCS.  HCS form completed and placed on chart.  Talked with wife over the phone.  She states pt works at [employer] in [place], [place]--she states pt was at work when the EMS was called.  She states pt does not use illegal drugs.  She states pt did not have DME or HH prior to admission.  She states pt's discharge goals will be to return home.      The patient will continue to be evaluated for developing discharge needs.     Case Manager: Florene Route, RN  Phone: [phone]

## 2022-11-12 NOTE — Nurses Notes (Signed)
Agreeable to stay in hospital and requesting something to help with anxiety. Lovenia Kim, PA-C made aware

## 2022-11-12 NOTE — Nurses Notes (Signed)
Patient wanting to leave hospital. Able to state it is February 2024 but unsure of day. States he is in the hospital but unsure of name and states " I think [place]." Verbalizes being unfamiliar with the [place] area and states he lives in [place], [place]. Lovenia Kim at bedside to assess and speak with patient.

## 2022-11-12 NOTE — ED Nurses Note (Signed)
Report called to Bettey Mare, RN in ICU

## 2022-11-12 NOTE — Care Plan (Signed)
REMAINS IN ICU. NARCAN DRIP HAS BEEN TITRATED OFF PER PROVIDER'S ORDER. NS IVF CONTINUES PER ORDER. REGULAR DIET ORDERED. EDUCATION AND TRANSITION READINESS ARE ONGOING. SEIZURE PRECAUTIONS REMAIN IN USE. FALL PRECAUTIONS IN USE.   Problem: Adult Inpatient Plan of Care  Goal: Patient-Specific Goal (Individualized)  Outcome: Ongoing (see interventions/notes)  Flowsheets  Taken 11/12/2022 1200  Individualized Care Needs: titrate narcan drip per provider order  Anxieties, Fears or Concerns: unable to assess  Patient-Specific Goals (Include Timeframe): improvement of mental status  Plan of Care Reviewed With: patient  Taken 11/12/2022 0800  Individualized Care Needs: titrate narcan drip per provider order  Anxieties, Fears or Concerns: unable to assess  Patient-Specific Goals (Include Timeframe): improvement of mental status  Plan of Care Reviewed With: patient

## 2022-11-12 NOTE — Nurses Notes (Signed)
Received call from patient's wife Giovante Mohring [phone]). Wife states patient takes no home prescription medications and denies illegal drugs. States patient does take "Zavia Silver" from a "smoke shop" when asked what this is, wife states it is a pill for anxiety. Wife denies patient having a history of seizures or other disorders.

## 2022-11-12 NOTE — Nurses Notes (Signed)
More awake. Assisted to stand at bedside to void. Gait steady. Assisted back to bed without difficulty. Tolerated lunch tray. Holds appropriate conversation and able to state date. Denies remembering events leading to hospitalization. Has talked to wife on phone and currently interacting with cell phone.

## 2022-11-13 LAB — COMPREHENSIVE METABOLIC PANEL, NON-FASTING
ALBUMIN/GLOBULIN RATIO: 1.8 — ABNORMAL HIGH (ref 0.8–1.4)
ALBUMIN: 3.6 g/dL (ref 3.5–5.7)
ALKALINE PHOSPHATASE: 67 U/L (ref 34–104)
ALT (SGPT): 247 U/L — ABNORMAL HIGH (ref 7–52)
ANION GAP: 6 mmol/L (ref 4–13)
AST (SGOT): 136 U/L — ABNORMAL HIGH (ref 13–39)
BILIRUBIN TOTAL: 1 mg/dL (ref 0.3–1.2)
BUN/CREA RATIO: 13 (ref 6–22)
BUN: 10 mg/dL (ref 7–25)
CALCIUM, CORRECTED: 8.6 mg/dL — ABNORMAL LOW (ref 8.9–10.8)
CALCIUM: 8.3 mg/dL — ABNORMAL LOW (ref 8.6–10.3)
CHLORIDE: 112 mmol/L — ABNORMAL HIGH (ref 98–107)
CO2 TOTAL: 23 mmol/L (ref 21–31)
CREATININE: 0.79 mg/dL (ref 0.60–1.30)
ESTIMATED GFR: 117 mL/min/{1.73_m2} (ref >59)
GLOBULIN: 2 — ABNORMAL LOW (ref 2.9–5.4)
GLUCOSE: 110 mg/dL — ABNORMAL HIGH (ref 74–109)
OSMOLALITY, CALCULATED: 281 mOsm/kg (ref 270–290)
POTASSIUM: 3.8 mmol/L (ref 3.5–5.1)
PROTEIN TOTAL: 5.6 g/dL — ABNORMAL LOW (ref 6.4–8.9)
SODIUM: 141 mmol/L (ref 136–145)

## 2022-11-13 LAB — CBC
HCT: 34.7 % — ABNORMAL LOW (ref 36.7–47.1)
HGB: 12.3 g/dL — ABNORMAL LOW (ref 12.5–16.3)
MCH: 31.9 pg (ref 23.8–33.4)
MCHC: 35.4 g/dL (ref 32.5–36.3)
MCV: 90.1 fL (ref 73.0–96.2)
MPV: 7.5 fL (ref 7.4–11.4)
PLATELETS: 189 10*3/uL (ref 140–440)
RBC: 3.85 10*6/uL — ABNORMAL LOW (ref 4.06–5.63)
RDW: 14.2 % (ref 12.1–16.2)
WBC: 6.9 10*3/uL (ref 3.6–10.2)

## 2022-11-13 LAB — TRICYCLIC SCREEN: TRICYCLICS SCREEN BLOOD: 200 ng/mL

## 2022-11-13 LAB — HEPATITIS B CORE IGM, AB: HBV CORE IGM ANTIBODY QUALITATIVE: NEGATIVE

## 2022-11-13 LAB — HEPATITIS C ANTIBODY SCREEN WITH REFLEX TO HCV PCR: HCV ANTIBODY QUALITATIVE: NEGATIVE

## 2022-11-13 LAB — HEPATITIS B SURFACE ANTIGEN: HBV SURFACE ANTIGEN QUALITATIVE: NEGATIVE

## 2022-11-13 LAB — HEPATITIS A (HAV) IGM ANTIBODY: HAV IGM: NEGATIVE

## 2022-11-13 LAB — MAGNESIUM: MAGNESIUM: 1.9 mg/dL (ref 1.9–2.7)

## 2022-11-13 MED ORDER — HYDROXYZINE PAMOATE 25 MG CAPSULE
25.0000 mg | ORAL_CAPSULE | Freq: Three times a day (TID) | ORAL | 0 refills | Status: DC | PRN
Start: 2022-11-13 — End: 2022-11-13

## 2022-11-13 MED ORDER — HYDROXYZINE PAMOATE 25 MG CAPSULE
25.0000 mg | ORAL_CAPSULE | Freq: Three times a day (TID) | ORAL | 0 refills | Status: AC | PRN
Start: 2022-11-13 — End: 2022-12-13

## 2022-11-13 MED ORDER — FLUTICASONE PROPIONATE 50 MCG/ACTUATION NASAL SPRAY,SUSPENSION
1.0000 | Freq: Two times a day (BID) | NASAL | Status: DC
Start: 2022-11-13 — End: 2022-11-13
  Administered 2022-11-13: 0 via NASAL
  Administered 2022-11-13: 1 via NASAL
  Filled 2022-11-13: qty 16

## 2022-11-13 NOTE — Care Management Notes (Signed)
Consult received for outpt psychiatric services in [facility] for panic disorder/depression.  [facility] called to scheduled appt for pt.  They only do walk-in psych evals for new pts on Tuesdays and Thursdays from 0830-1100.  They will scheduled pt for follow up appts once the initial walk in eval is completed.  Information given to pt.  Pt verbalizes he knows where [facility] is in [place], [place] is located.  He verbalizes understanding that he needs to go to the office on a Tues of Thursday between 0830-1100 to get established as a pt.  Information also given to nursing staff regarding appt for psychiatric services.

## 2022-11-13 NOTE — Nurses Notes (Signed)
Pt discharged home with wife at this time. Went over AVS with patient.   Appointments on AVS. Attempted to make appointment with [facility] in [place], [place]; however, for therapist evaluation it is only walk-in for the first evaluation. These evaluations are Tuesdays or Thursdays 8:30-11:30 am. After first evaluation, follow up appointments are made. It is up to the patient to walk-in for evaluation, patient made aware of this and verbalized understanding. This was included with the patient's AVS, as well as Return to Work paper.

## 2022-11-22 DIAGNOSIS — I4711 Inappropriate sinus tachycardia, so stated: Secondary | ICD-10-CM

## 2022-11-22 LAB — ECG 12 LEAD
Atrial Rate: 104 {beats}/min
Calculated P Axis: 68 degrees
Calculated R Axis: 46 degrees
Calculated T Axis: 44 degrees
PR Interval: 144 ms
QRS Duration: 92 ms
QT Interval: 324 ms
QTC Calculation: 426 ms
Ventricular rate: 104 {beats}/min

## 2022-12-02 ENCOUNTER — Encounter (INDEPENDENT_AMBULATORY_CARE_PROVIDER_SITE_OTHER): Payer: Self-pay | Admitting: NEUROLOGY

## 2022-12-06 ENCOUNTER — Ambulatory Visit (INDEPENDENT_AMBULATORY_CARE_PROVIDER_SITE_OTHER): Payer: BC Managed Care – PPO | Admitting: NEUROLOGY

## 2022-12-06 ENCOUNTER — Ambulatory Visit (INDEPENDENT_AMBULATORY_CARE_PROVIDER_SITE_OTHER): Payer: Self-pay | Admitting: NEUROLOGY

## 2022-12-06 ENCOUNTER — Other Ambulatory Visit: Payer: Self-pay

## 2022-12-10 ENCOUNTER — Encounter (INDEPENDENT_AMBULATORY_CARE_PROVIDER_SITE_OTHER): Payer: Self-pay | Admitting: NEUROLOGY

## 2022-12-10 ENCOUNTER — Ambulatory Visit (INDEPENDENT_AMBULATORY_CARE_PROVIDER_SITE_OTHER): Payer: BC Managed Care – PPO | Admitting: NEUROLOGY

## 2022-12-10 ENCOUNTER — Other Ambulatory Visit: Payer: Self-pay

## 2022-12-10 VITALS — BP 146/80 | HR 89 | Temp 99.7°F | Ht 72.0 in | Wt 200.0 lb

## 2022-12-10 DIAGNOSIS — R404 Transient alteration of awareness: Secondary | ICD-10-CM

## 2022-12-10 NOTE — Progress Notes (Signed)
ASSESSMENT  EPISODE OF ALTERED CONSCIOUSNESS: He is a 37 year old man who is referred following recent hospitalization for altered level of consciousness. There is very little in the way of details regarding the event as there may have been no witnesses. He was found to be altered by co-workers. As far as I can tell, there was no witnessed activity concerning for seizure. There are several factors that may have contributed to his presentation. He was abruptly cut off from taking gabapentin as his PCP left (was previously on 600 mg TID), as a result, he reports sleeping about an hour per night for the preceding week. He also reports taking a supplement in the months prior that contained a TCA. Workup in the hospital was unremarkable including MRI and EEG.  I cannot definitively say whether this episode represented seizure activity. If he truly had a seizure, it would be considered provoked and would not require medications (would also be first time seizure so no meds required). I have no reason to suspect he would have a recurrent event at this time.     PLAN  No further neurologic workup        No need for ASMs        OK from neurologic standpoint to return to work    RTC PRN    Thank you for allowing me to participate in your patient's care and please do not hesitate to contact me for any questions or concerns.    Adrian Prows, DO  Assistant Professor of Neurology  [facility]     I personally spent a total of 45 minutes today preparing to see the patient, in the encounter with the patient, and documenting after the visit.  ==========================================================================================================================================    NAME:  Clayton Chandler  DOB:  January 20, 1986  VISIT DATE:  12/10/2022    CC:  Altered consciousness    Patient seen as follow-up from recent hospitalization  History obtained from the patient and chart/records  Age of  patient:  37 y.o.      HPI:   I had the pleasure of seeing your patient in neurology clinic for an outpatient consultation, who is a 37 y.o. year old male who was referred for evaluation of altered consciousness.  Please allow me to summarize the history for the record.      February 13 he remembers going to work and some of the events that happened at work. He recalls being approached by a coworker asking if he was alright as he had a small cut on his forehead that was bleeding, but he didn't recall falling. The next thing he remembers is EMS taking him to the hospital. He doesn't know if there was any seizure-like activity. He didn't feel back to himself until a few days later after leaving the hospital as he was having significant issues with anxiety (reports history of panic attacks). He has not had any similar events.   He states that leading up to the event his gabapentin had been abruptly discontinued as PCP left previous practice. He was on 600 mg TID for history of sciatica symptoms reports DDD and compression fractures. He was unable to sleep more than 1 hour per night in the 5 days leading up to the event. He also reports taking a supplement with a TCA in it which he had been doing for the past couple months. He has since discontinued this. No further events of concern since discharge.    ============================================================================================================================================  PMHx  Patient Active Problem List   Diagnosis    Episode of altered consciousness     Past Surgical History:   Procedure Laterality Date    PILONIDAL CYST / SINUS EXCISION         Family Medical History:    None         Current Outpatient Medications   Medication Sig Dispense Refill    hydrOXYzine pamoate (VISTARIL) 25 mg Oral Capsule Take 1 Capsule (25 mg total) by mouth Three times a day as needed for Anxiety for up to 30 days 30 Capsule 0     No current facility-administered  medications for this visit.     Allergies   Allergen Reactions    Influenza A (H5n1) Virus Vaccine Monoval (18 Yr +)  Other Adverse Reaction (Add comment)     UNKNOWN     Social History     Socioeconomic History    Marital status: Unknown     Spouse name: Not on file    Number of children: Not on file    Years of education: Not on file    Highest education level: Not on file   Occupational History    Not on file   Tobacco Use    Smoking status: Former     Current packs/day: 1.00     Average packs/day: 1 pack/day for 14.2 years (14.2 ttl pk-yrs)     Types: Cigarettes     Start date: 2010    Smokeless tobacco: Never   Vaping Use    Vaping status: Never Used   Substance and Sexual Activity    Alcohol use: Never    Drug use: Never    Sexual activity: Not on file   Other Topics Concern    Not on file   Social History Narrative    Not on file     Social Determinants of Health     Financial Resource Strain: Not on file   Transportation Needs: Not on file   Social Connections: Not on file   Intimate Partner Violence: Not on file   Housing Stability: Not on file       ============================================================================================================================================  GENERAL EXAMINATION  BP (!) 146/80 (Site: Left, Patient Position: Sitting, Cuff Size: Adult)   Pulse 89   Temp 37.6 C (99.7 F) (Temporal)   Ht 1.829 m (6')   Wt 90.7 kg (200 lb)   SpO2 99%   BMI 27.12 kg/m     Vital signs personally reviewed  General: No acute distress, alert  HEENT: Normocephalic, no scleral icterus  Pulmonary: No accessory muscle use, no tachypnea  Cardiovascular: Heart with regular rate & rhythm  Extremities: No significant edema, No cyanosis    NEUROLOGIC EXAM  On neurological exam, patient was awake, alert and answering questions appropriately  Speech was fluent, without dysarthria or aphasia.    CN  II: not directly tested, grossly intact  III, IV, VI: extraocular movements intact without  nystagmus  V: intact to light touch  VII: face symmetric without weakness  VIII: grossly intact  IX, X: symmetric palatal elevation  XI: normal strength of trapezius and sternocleidomastoid bilaterally  XII: tongue midline with full movements    MOTOR  Bulk: normal  Abnormal Movements: none    Strength:     MRC Grading Scale   Right Left   Deltoid 5 5   Biceps 5 5   Triceps 5 5   Wrist Extension 5 5   Wrist Flexion - -  Finger Extension 5 5   Finger Abduction 5 5   Finger Flexion 5 5   Hip Flexion 5 5   Hip Extension - -   Hip Abduction - -   Hip Adduction - -   Knee Extension 5 5   Knee Flexion 5 5   Ankle Dorsiflexion 5 5   Ankle Plantarflexion 5 5   Toe Extension - -   Toe Flexion - -     REFLEXES   Right Left   Biceps 2 2   Triceps 2 2   Brachioradialis 2 2   Patellar 2 2   Achilles 2 2   Plantar - -   Hoffman - -   Pectoralis - -   Jaw Jerk - -       SENSORY  Light touch: some diminishment in right leg compared to left which he states is chronic from sciatica issues    GAIT  General: casual, normal gait  Toe Walk: normal  Heel Walk: normal  Tandem: normal    COORDINATION  Finger nose finger: normal    ================================================================================================================================LABS  Personal Review of prior labs is notable for: 2024  CMP notable for elevated ALT and AST  CBC largely within normal limits  Hepatitis panel negative   CK 141  UDS negative aside from benzodiazepine  Ethanol negative  Lactate normal  IMAGING  Personal Review of imaging is notable for:  Noncontrasted head CT November 12, 2022 essentially normal study    OTHER DIAGNOSTICS  Personal Review of other prior diagnostics is notable for:   Routine EEG November 12, 2022    ELECTROENCEPHALOGRAM REPORT     Test Date:  November 12, 2022     Interpretation Date:  November 12, 2022     Location:  [facility]     Patient Referred By: Berkley Harvey, MD     EEG Number:  [id number]         INDICATIONS FOR PROCEDURE:  He has a 37 year old man who was admitted for altered mental status.  Electrodiagnostic testing ordered for concern of possible seizure activity prior to arrival.     Medications:   acetaminophen (TYLENOL) tablet, 650 mg, Oral, Q4H PRN  alcohol 62 % (NOZIN NASAL SANITIZER) nasal solution, 1 Each, Each Nostril, 2x/day  heparin 5,000 unit/mL injection, 5,000 Units, Subcutaneous, Q8HRS  naloxone (NARCAN) 4 mg in NS 250 mL infusion, 0.25 mg/hr, Intravenous, Continuous  NS flush syringe, 3 mL, Intracatheter, Q8HRS  NS flush syringe, 3 mL, Intracatheter, Q1H PRN  NS premix infusion, , Intravenous, Continuous  ondansetron (ZOFRAN) 2 mg/mL injection, 4 mg, Intravenous, Q6H PRN           DESCRIPTION OF PROCEDURE: Electrodes were applied using paste technique in positions dictated by the International 10-20 system of placement. Recording montages included both referential and bipolar derivations. In addition to EEG data, EKG was recorded.     This is a routine EEG recorded on November 12, 2022, running from 08:32:51 until 09:07:22 for 30 minutes and 15 seconds of data.     DESCRIPTION OF ACTIVITIES: At the onset of the recording, the patient is unresponsive with eyes closed on the stretcher.  There is no clear posterior dominant rhythm.  There is no anterior to posterior gradient throughout most of the recording.  There is profound generalized slowing.  There are intermittent generalized discharges of triphasic morphology.     No clear sleep architecture is seen.     Photic stimulation does  not produce a driving response or epileptiform activation. Hyperventilation is not performed.     No epileptiform discharges or seizures are seen.     A single channel EKG shows normal sinus rhythm.     CLINICAL INTERPRETATION: This routine length EEG with video is abnormal due to disorganization of the background, generalized slowing and intermittent discharges of triphasic morphology.  This is indicative of  moderate to severe bihemispheric dysfunction. While nonspecific, triphasic waves are commonly seen in hepatic encephalopathy. No epileptiform discharges or seizures are seen.  Clinical correlation is advised.

## 2023-04-23 IMAGING — MR MRI LUMBAR SPINE WITHOUT CONTRAST
6 of 7 series · 38 of 48 positions shown · IV contrast (gadolinium)
Comparison: None available.
COMPARISON: None available.

------------- REPORT [id number] -------------
﻿EXAM:  [id number]   MRI THORACIC SPINE WITHOUT CONTRAST
INDICATION: .
TECHNIQUE: Multiplanar multisequential MRI of the  was performed without gadolinium contrast.
INDICATION: 36-year-old with past history of trauma 10 years ago to the back with history of compression fracture of T11.  Upper back pain and low back pain. Radicular symptoms to the right lower extremity.  No history of spine surgery.
TECHNIQUE: Multiplanar, multisequential MRI of the thoracic and lumbosacral spine was performed without gadolinium contrast.

[Series 5: T2 · sagittal · 4.5mm · 0.94mm/px · 4 of 13 slices shown (1 of 3)]
[im 1/13]
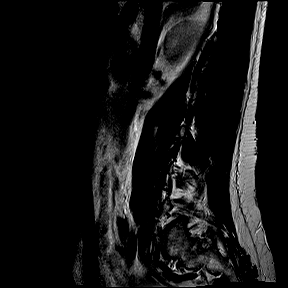
[im 5/13]
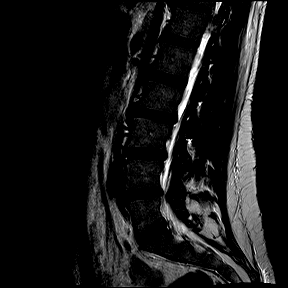
[im 9/13]
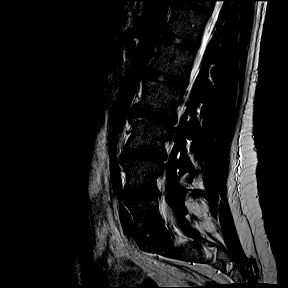
[im 13/13]
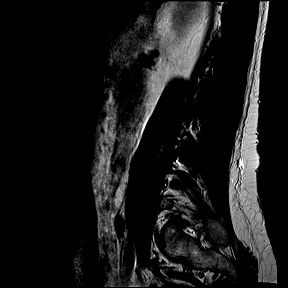

[Series 6: T1 · sagittal · 4.5mm · 0.94mm/px · 4 of 13 slices shown (1 of 3)]
[im 1/13]
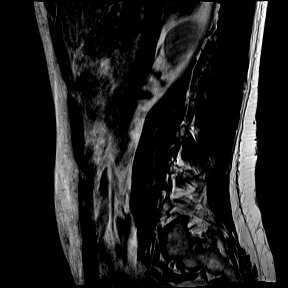
[im 5/13]
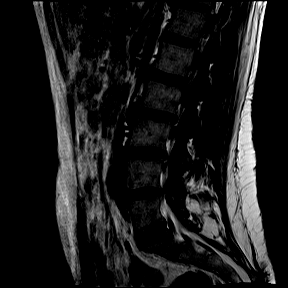
[im 9/13]
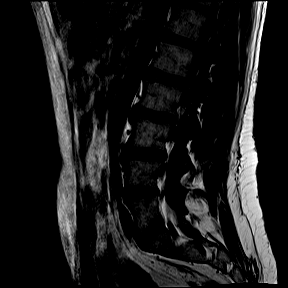
[im 13/13]
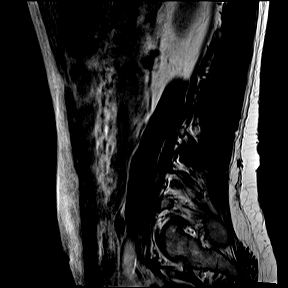

[Series 8: T2 · coronal · 4.0mm · 1.46mm/px · 8 of 20 slices shown (2 of 3)]
[im 1/20]
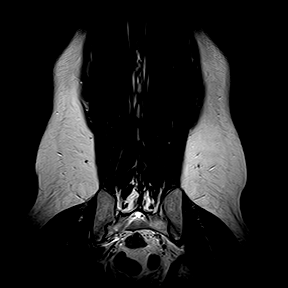
[im 3/20]
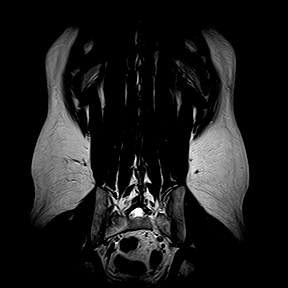
[im 6/20]
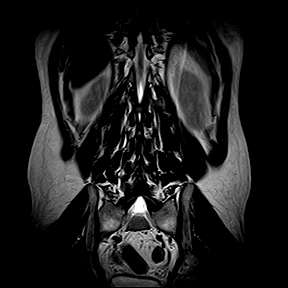
[im 9/20]
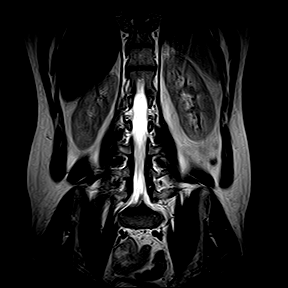
[im 11/20]
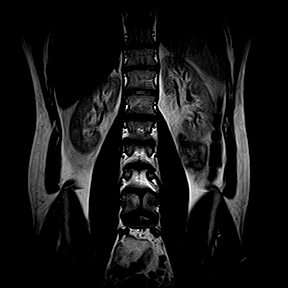
[im 14/20]
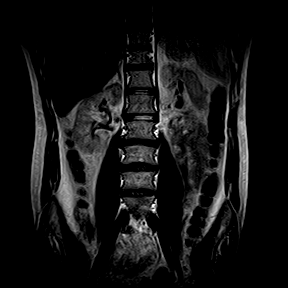
[im 17/20]
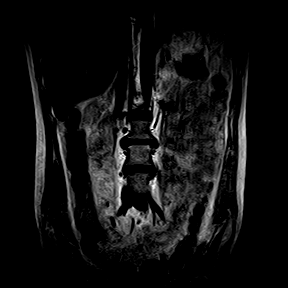
[im 20/20]
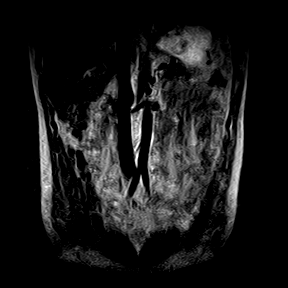

[Series 9: T2 · axial · 4.0mm · 0.47mm/px · z∈[-154,+66]mm · 9 of 23 slices shown (3 of 3)]
[im 1/23]
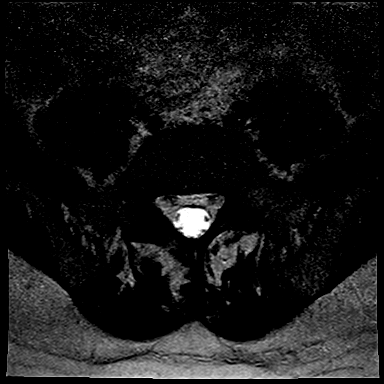
[im 3/23]
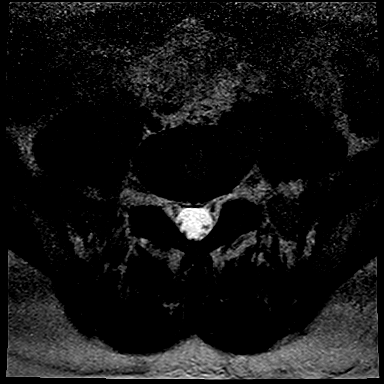
[im 6/23]
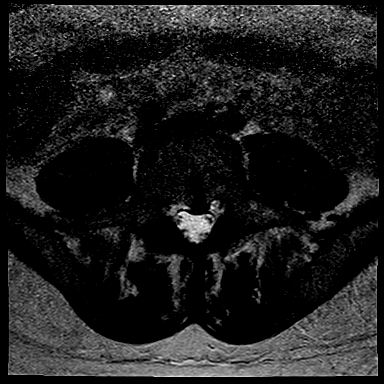
[im 9/23]
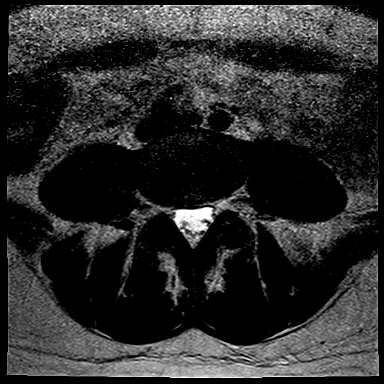
[im 12/23]
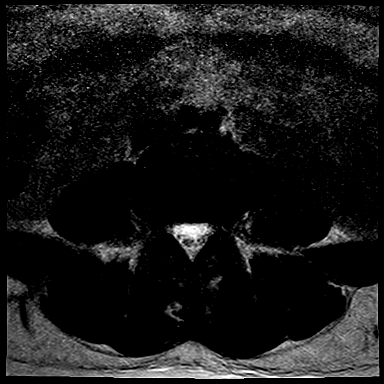
[im 14/23]
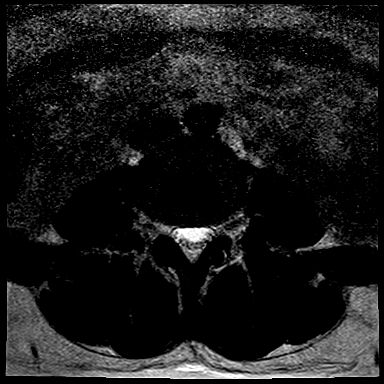
[im 17/23]
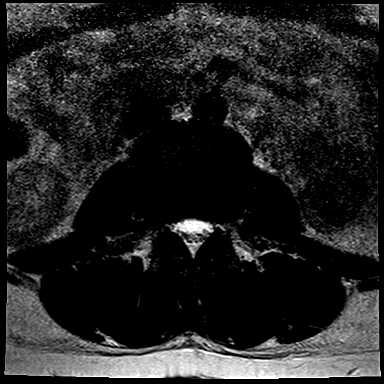
[im 20/23]
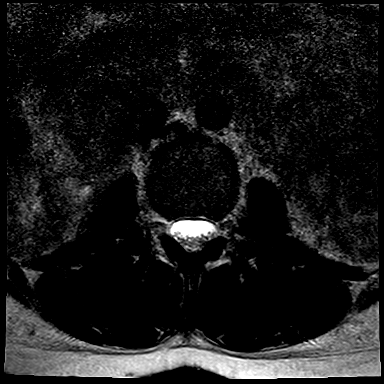
[im 23/23]
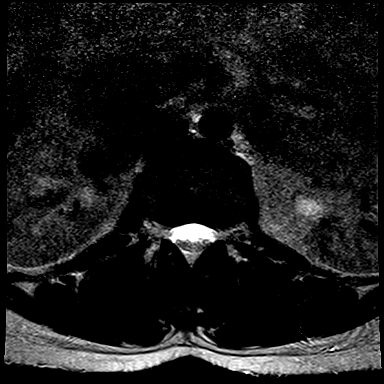

[Series 10: T1 · axial · 4.0mm · 0.47mm/px · z∈[-154,+66]mm · 8 of 23 slices shown (2 of 3)]
[im 1/23]
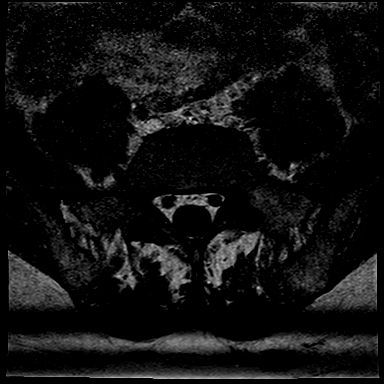
[im 3/23]
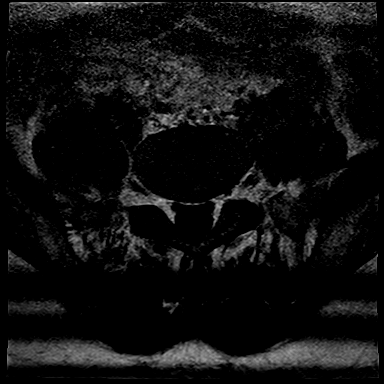
[im 6/23]
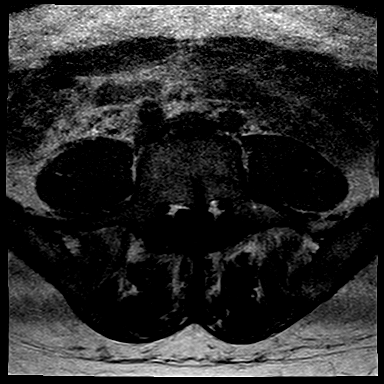
[im 9/23]
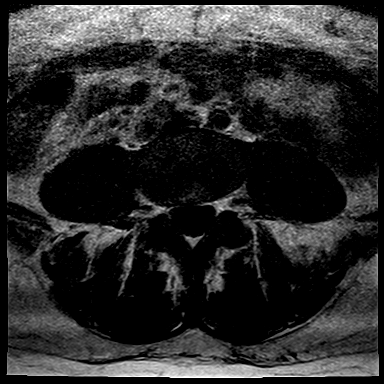
[im 14/23]
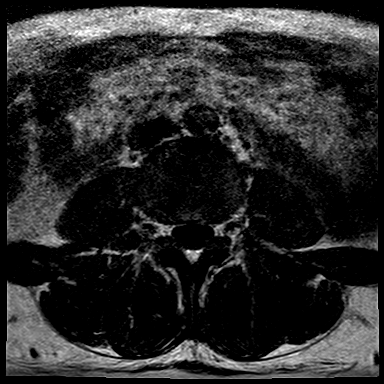
[im 17/23]
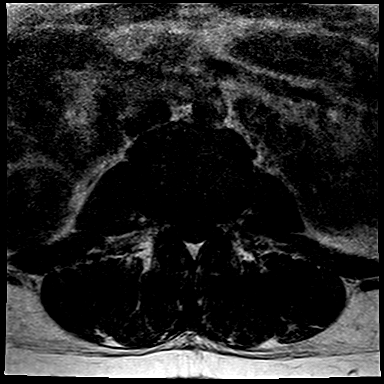
[im 20/23]
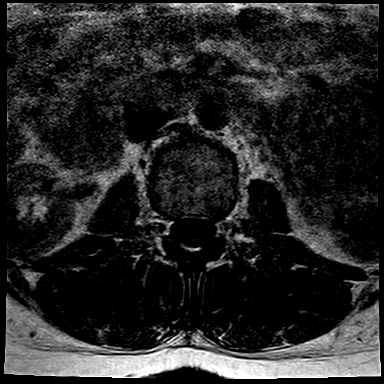
[im 23/23]
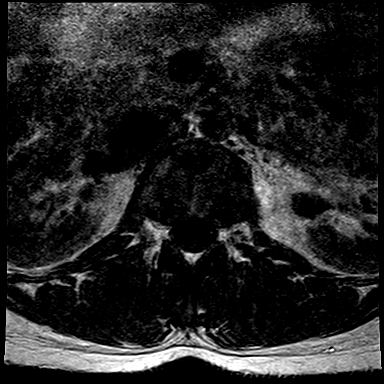

[Series 11: T1 · axial · 4.0mm · 0.47mm/px · z∈[-154,-22]mm · 5 of 23 slices shown (3 of 3)]
[im 1/23]
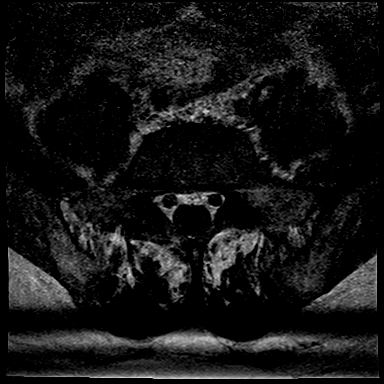
[im 3/23]
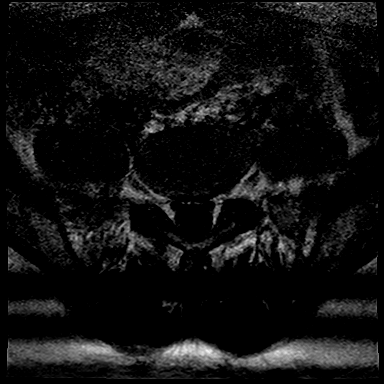
[im 6/23]
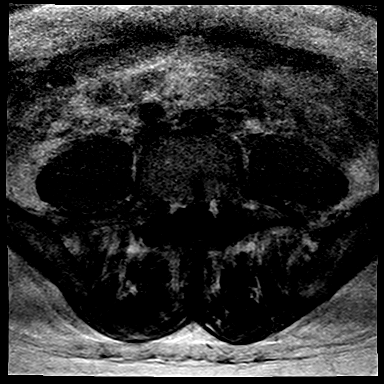
[im 9/23]
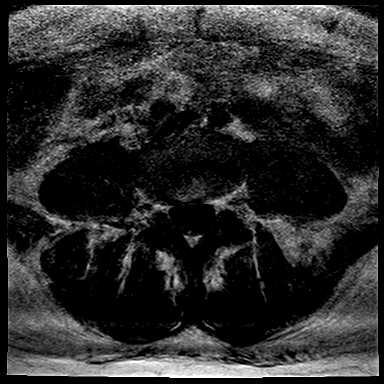
[im 14/23]
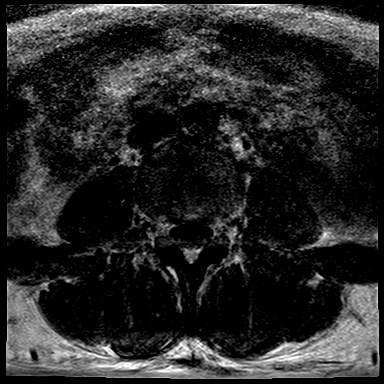

[38 of 48 positions shown; findings below may reference images not displayed]

FINDINGS: 
IMPRESSION: ------------- REPORT [id number] -------------
﻿EXAM:  [DATE]   MRI LUMBAR SPINE WITHOUT CONTRAST,MRI THORACIC SPINE WITHOUT CONTRAST
FINDINGS: No acute bony lesions of thoracic vertebrae.  Mild old compression fracture with anterior wedging of T11 vertebral body consistent with past history.

No evidence of thoracic disc herniation or spinal stenosis is noted.  No significant foraminal stenosis in the thoracic region.  Thoracic spinal cord shows no focal abnormalities.

In the lumbar spine, no acute bony lesions.  Mild congenital deformity of L5 vertebral body due to congenital hemivertebra.  Vertically oriented cleft is noted within the mid L5 vertebral body for this reason.

Conus medullaris and cauda equina structures are normal in the sagittal projection.  

At L1-2 level, no focal disc lesions. Facet arthropathy causes mild compromise of both lateral recess. 

At L2-3 level, degenerative disc disease and facet arthropathy are causing moderate compromise of both lateral recess and neural foramina.

At L3-4 level, degenerative disc disease with bulging annulus and facet arthropathy are causing moderate compromise of both lateral recess and neural foramina. Mild compromise of thecal sac with AP diameter in the midline measuring 9.4 mm. 

At L4-5 level, degenerative disc disease and facet arthropathy are causing significant right foraminal narrowing. Mild left foraminal narrowing is noted.

At L5-S1 level, degenerative disc disease and facet arthropathy are causing mild biforaminal narrowing.

Paravertebral soft tissues are unremarkable.
IMPRESSION: 1. No acute bony lesions of thoracic spine. Mild old compression fracture of T11 vertebral body with anterior wedging.

2. No evidence of thoracic disc herniation, spinal stenosis or foraminal stenosis in the thoracic region.

3. Mild congenital anomaly of L5 vertebral body with hemivertebra changes as described above.

4. Degenerative disc changes and facet arthropathy are noted, predominantly at L2-3, L3-4 and L4-5 levels as described above in detail.

## 2023-04-23 IMAGING — MR MRI THORACIC SPINE WITHOUT CONTRAST
4 of 5 series · 25 of 48 positions shown · IV contrast (gadolinium)
Comparison: None available.
COMPARISON: None available.

------------- REPORT [id number] -------------
﻿EXAM:  [id number]   MRI THORACIC SPINE WITHOUT CONTRAST
INDICATION: .
TECHNIQUE: Multiplanar multisequential MRI of the  was performed without gadolinium contrast.
INDICATION: 36-year-old with past history of trauma 10 years ago to the back with history of compression fracture of T11.  Upper back pain and low back pain. Radicular symptoms to the right lower extremity.  No history of spine surgery.
TECHNIQUE: Multiplanar, multisequential MRI of the thoracic and lumbosacral spine was performed without gadolinium contrast.

[Series 5: T2 · sagittal · 4.0mm · 0.78mm/px · 8 of 13 slices shown (1 of 2)]
[im 1/13]
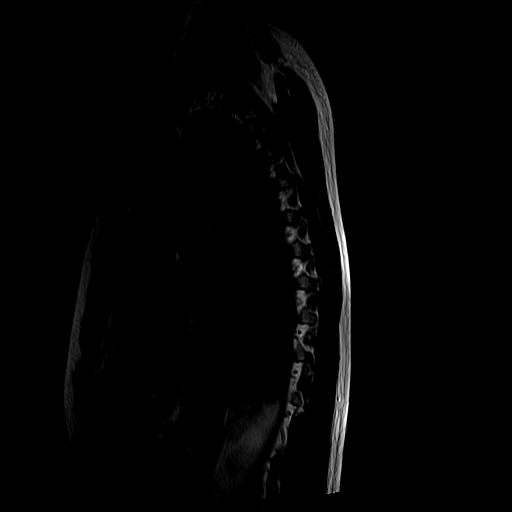
[im 2/13]
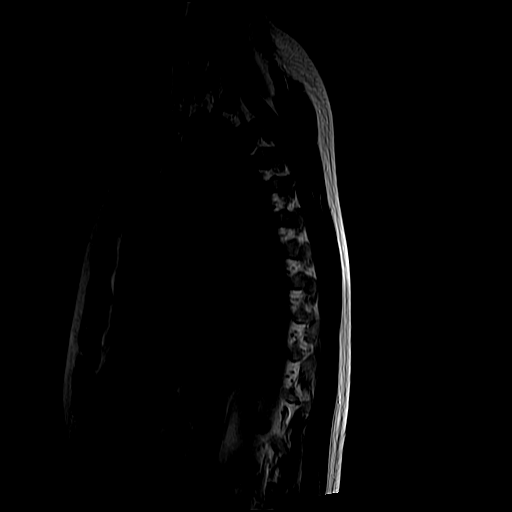
[im 5/13]
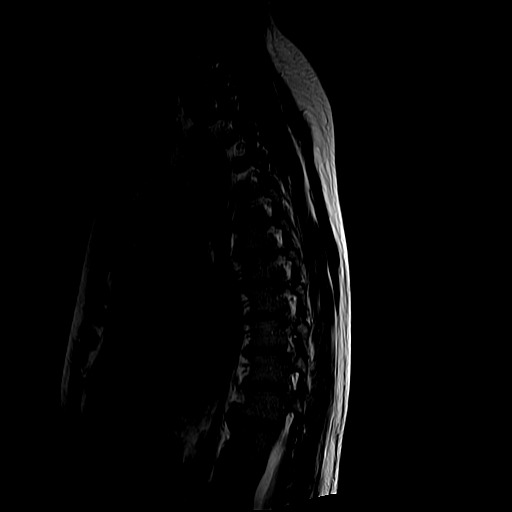
[im 6/13]
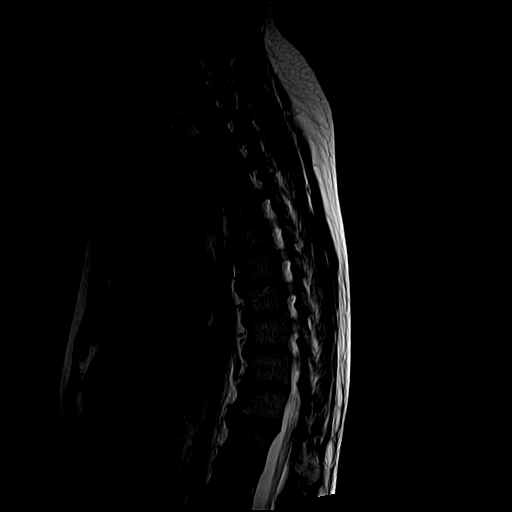
[im 7/13]
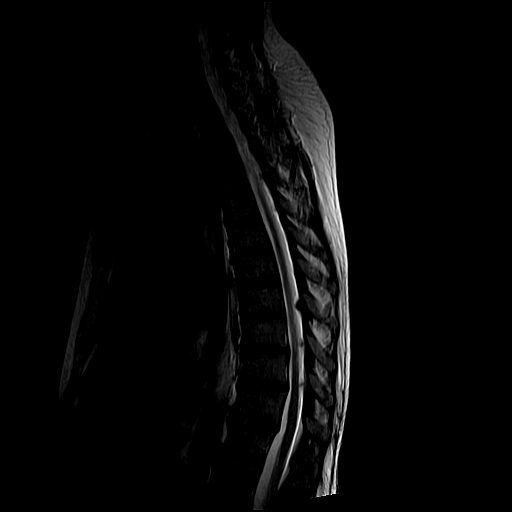
[im 9/13]
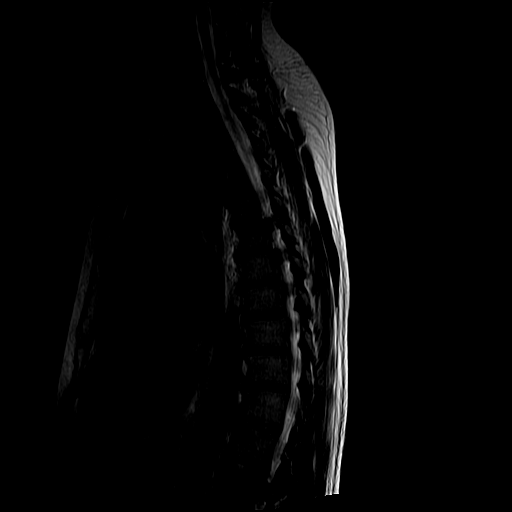
[im 11/13]
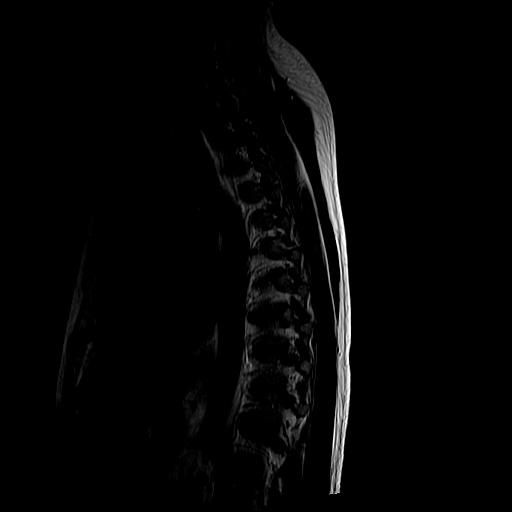
[im 13/13]
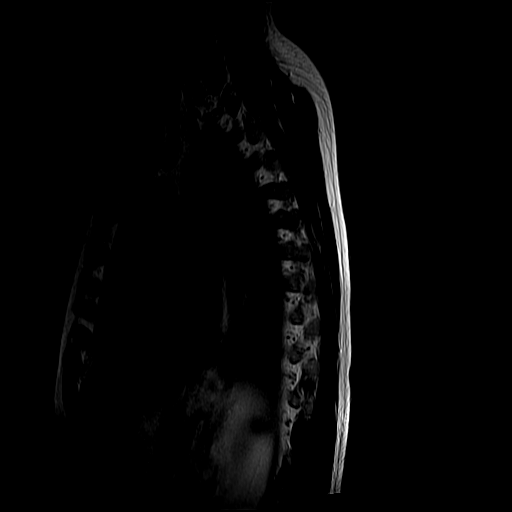

[Series 6: T1 · sagittal · 4.0mm · 0.78mm/px · 5 of 13 slices shown (1 of 2)]
[im 1/13]
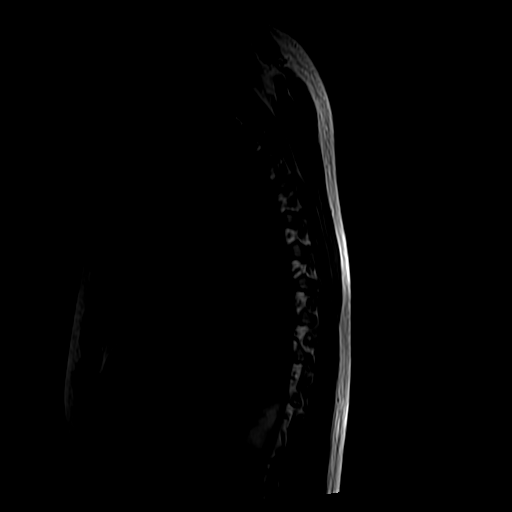
[im 2/13]
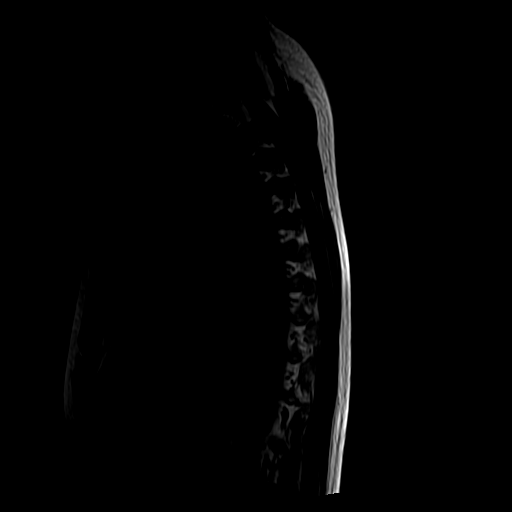
[im 5/13]
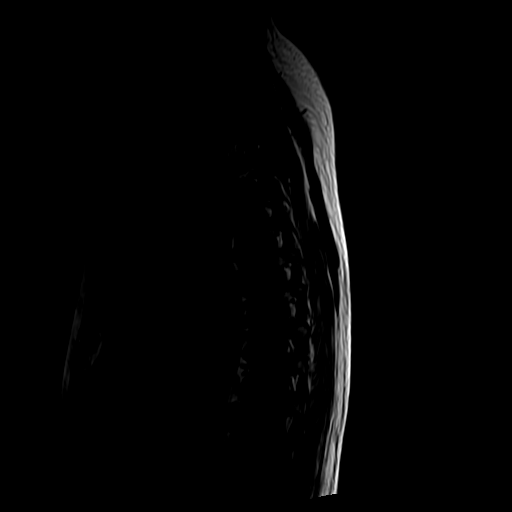
[im 7/13]
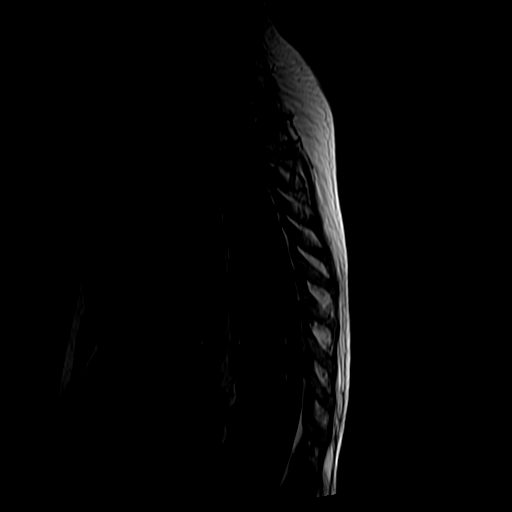
[im 11/13]
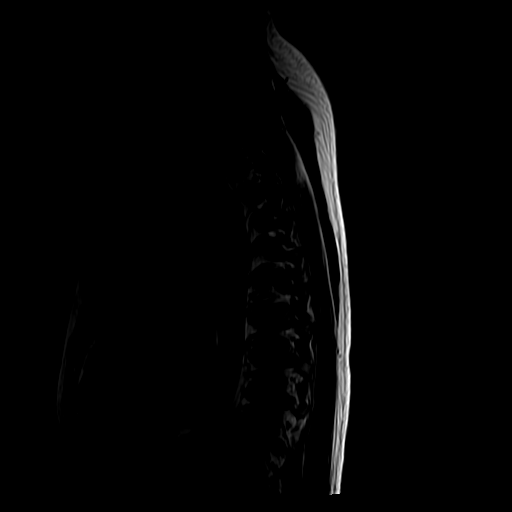

[Series 8: T2 · axial · 4.0mm · 0.62mm/px · z∈[-278,-75]mm · 9 of 12 slices shown (2 of 2)]
[im 1/12]
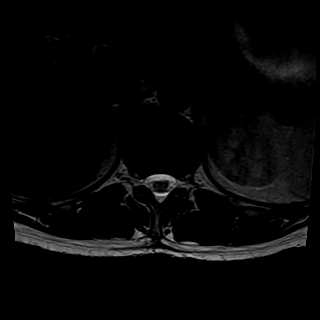
[im 2/12]
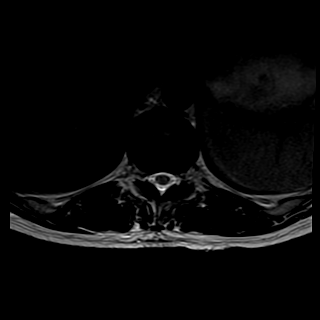
[im 3/12]
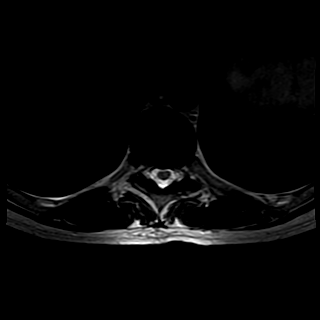
[im 5/12]
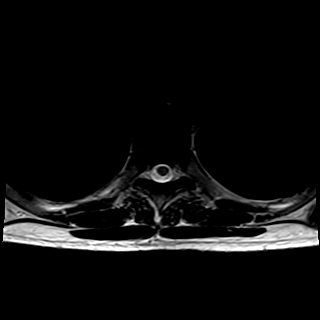
[im 6/12]
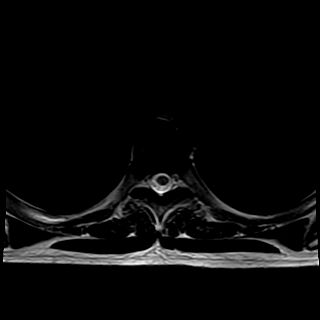
[im 7/12]
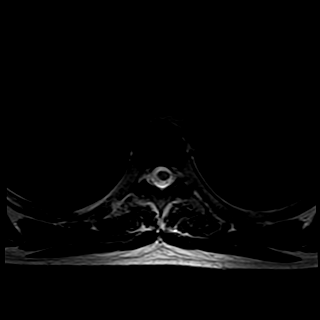
[im 9/12]
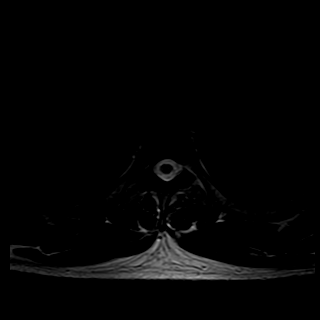
[im 10/12]
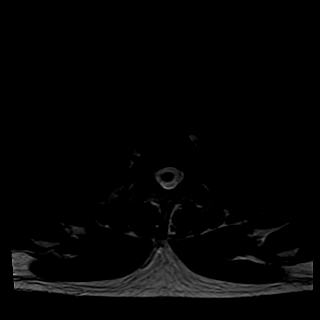
[im 12/12]
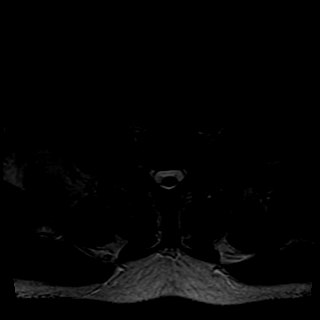

[Series 9: T1 · axial · 4.0mm · 0.62mm/px · z∈[-262,-108]mm · 3 of 12 slices shown (2 of 2)]
[im 2/12]
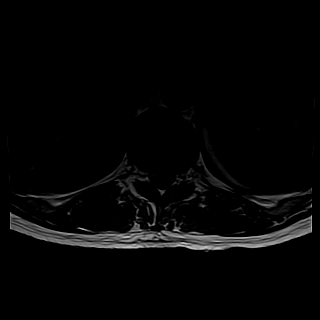
[im 6/12]
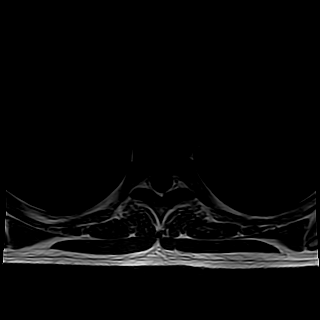
[im 10/12]
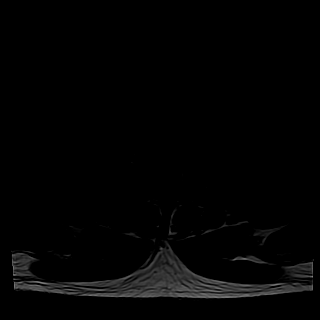

[25 of 48 positions shown; findings below may reference images not displayed]

FINDINGS: 
IMPRESSION: ------------- REPORT [id number] -------------
﻿EXAM:  [DATE]   MRI LUMBAR SPINE WITHOUT CONTRAST,MRI THORACIC SPINE WITHOUT CONTRAST
FINDINGS: No acute bony lesions of thoracic vertebrae.  Mild old compression fracture with anterior wedging of T11 vertebral body consistent with past history.

No evidence of thoracic disc herniation or spinal stenosis is noted.  No significant foraminal stenosis in the thoracic region.  Thoracic spinal cord shows no focal abnormalities.

In the lumbar spine, no acute bony lesions.  Mild congenital deformity of L5 vertebral body due to congenital hemivertebra.  Vertically oriented cleft is noted within the mid L5 vertebral body for this reason.

Conus medullaris and cauda equina structures are normal in the sagittal projection.  

At L1-2 level, no focal disc lesions. Facet arthropathy causes mild compromise of both lateral recess. 

At L2-3 level, degenerative disc disease and facet arthropathy are causing moderate compromise of both lateral recess and neural foramina.

At L3-4 level, degenerative disc disease with bulging annulus and facet arthropathy are causing moderate compromise of both lateral recess and neural foramina. Mild compromise of thecal sac with AP diameter in the midline measuring 9.4 mm. 

At L4-5 level, degenerative disc disease and facet arthropathy are causing significant right foraminal narrowing. Mild left foraminal narrowing is noted.

At L5-S1 level, degenerative disc disease and facet arthropathy are causing mild biforaminal narrowing.

Paravertebral soft tissues are unremarkable.
IMPRESSION: 1. No acute bony lesions of thoracic spine. Mild old compression fracture of T11 vertebral body with anterior wedging.

2. No evidence of thoracic disc herniation, spinal stenosis or foraminal stenosis in the thoracic region.

3. Mild congenital anomaly of L5 vertebral body with hemivertebra changes as described above.

4. Degenerative disc changes and facet arthropathy are noted, predominantly at L2-3, L3-4 and L4-5 levels as described above in detail.

## 2024-08-10 ENCOUNTER — Ambulatory Visit (INDEPENDENT_AMBULATORY_CARE_PROVIDER_SITE_OTHER): Payer: Self-pay | Admitting: NEUROLOGY

## 2024-08-10 NOTE — Progress Notes (Deleted)
 ASSESSMENT  EPISODE OF ALTERED CONSCIOUSNESS: He is a 38 year old man who is referred following recent hospitalization for altered level of consciousness. There is very little in the way of details regarding the event as there may have been no witnesses. He was found to be altered by co-workers. As far as I can tell, there was no witnessed activity concerning for seizure. There are several factors that may have contributed to his presentation. He was abruptly cut off from taking gabapentin as his PCP left (was previously on 600 mg TID), as a result, he reports sleeping about an hour per night for the preceding week. He also reports taking a supplement in the months prior that contained a TCA. Workup in the hospital was unremarkable including MRI and EEG.  I cannot definitively say whether this episode represented seizure activity. If he truly had a seizure, it would be considered provoked and would not require medications (would also be first time seizure so no meds required). I have no reason to suspect he would have a recurrent event at this time.     PLAN  No further neurologic workup        No need for ASMs        OK from neurologic standpoint to return to work    RTC PRN    Thank you for allowing me to participate in your patient's care and please do not hesitate to contact me for any questions or concerns.    S. Zach Hisayo Delossantos, DO  Assistant Professor of Neurology  [place]  [facility]     I personally spent a total of 45 minutes today preparing to see the patient, in the encounter with the patient, and documenting after the visit.  ==========================================================================================================================================    NAME:  Clayton Chandler  DOB:  1985-10-03  VISIT DATE:  12/10/2022    CC:  Altered consciousness    Patient seen as follow-up from recent hospitalization  History obtained from the patient and chart/records  Age of  patient:  38 y.o.      HPI:   I had the pleasure of seeing your patient in neurology clinic for an outpatient consultation, who is a 38 y.o. year old male who was referred for evaluation of altered consciousness.  Please allow me to summarize the history for the record.      February 13 he remembers going to work and some of the events that happened at work. He recalls being approached by a coworker asking if he was alright as he had a small cut on his forehead that was bleeding, but he didn't recall falling. The next thing he remembers is EMS taking him to the hospital. He doesn't know if there was any seizure-like activity. He didn't feel back to himself until a few days later after leaving the hospital as he was having significant issues with anxiety (reports history of panic attacks). He has not had any similar events.   He states that leading up to the event his gabapentin had been abruptly discontinued as PCP left previous practice. He was on 600 mg TID for history of sciatica symptoms reports DDD and compression fractures. He was unable to sleep more than 1 hour per night in the 5 days leading up to the event. He also reports taking a supplement with a TCA in it which he had been doing for the past couple months. He has since discontinued this. No further events of concern since discharge.    ============================================================================================================================================  PMHx  Patient Active Problem List   Diagnosis    Episode of altered consciousness     Past Surgical History:   Procedure Laterality Date    PILONIDAL CYST / SINUS EXCISION         Family Medical History:    None         No current outpatient medications on file.     No current facility-administered medications for this visit.     Allergies   Allergen Reactions    Influenza A (H5n1) Virus Vaccine Monoval (18y Up)  Other Adverse Reaction (Add comment)     UNKNOWN     Social History      Socioeconomic History    Marital status: Unknown     Spouse name: Not on file    Number of children: Not on file    Years of education: Not on file    Highest education level: Not on file   Occupational History    Not on file   Tobacco Use    Smoking status: Former     Current packs/day: 1.00     Average packs/day: 1 pack/day for 15.9 years (15.9 ttl pk-yrs)     Types: Cigarettes     Start date: 2010    Smokeless tobacco: Never   Vaping Use    Vaping status: Never Used   Substance and Sexual Activity    Alcohol  use: Never    Drug use: Never    Sexual activity: Not on file   Other Topics Concern    Not on file   Social History Narrative    Not on file     Social Determinants of Health     Financial Resource Strain: Not on file   Transportation Needs: Not on file   Social Connections: Not on file   Intimate Partner Violence: Not on file   Housing Stability: Not on file       ============================================================================================================================================  GENERAL EXAMINATION  There were no vitals taken for this visit.    Vital signs personally reviewed  General: No acute distress, alert  HEENT: Normocephalic, no scleral icterus  Pulmonary: No accessory muscle use, no tachypnea  Cardiovascular: Heart with regular rate & rhythm  Extremities: No significant edema, No cyanosis    NEUROLOGIC EXAM  On neurological exam, patient was awake, alert and answering questions appropriately  Speech was fluent, without dysarthria or aphasia.    CN  II: not directly tested, grossly intact  III, IV, VI: extraocular movements intact without nystagmus  V: intact to light touch  VII: face symmetric without weakness  VIII: grossly intact  IX, X: symmetric palatal elevation  XI: normal strength of trapezius and sternocleidomastoid bilaterally  XII: tongue midline with full movements    MOTOR  Bulk: normal  Abnormal Movements: none    Strength:     MRC Grading Scale   Right Left    Deltoid 5 5   Biceps 5 5   Triceps 5 5   Wrist Extension 5 5   Wrist Flexion - -   Finger Extension 5 5   Finger Abduction 5 5   Finger Flexion 5 5   Hip Flexion 5 5   Hip Extension - -   Hip Abduction - -   Hip Adduction - -   Knee Extension 5 5   Knee Flexion 5 5   Ankle Dorsiflexion 5 5   Ankle Plantarflexion 5 5   Toe Extension - -  Toe Flexion - -     REFLEXES   Right Left   Biceps 2 2   Triceps 2 2   Brachioradialis 2 2   Patellar 2 2   Achilles 2 2   Plantar - -   Hoffman - -   Pectoralis - -   Jaw Jerk - -       SENSORY  Light touch: some diminishment in right leg compared to left which he states is chronic from sciatica issues    GAIT  General: casual, normal gait  Toe Walk: normal  Heel Walk: normal  Tandem: normal    COORDINATION  Finger nose finger: normal    ================================================================================================================================LABS  Personal Review of prior labs is notable for: 2024  CMP notable for elevated ALT and AST  CBC largely within normal limits  Hepatitis panel negative   CK 141  UDS negative aside from benzodiazepine  Ethanol negative  Lactate normal  IMAGING  Personal Review of imaging is notable for:  Noncontrasted head CT November 12, 2022 essentially normal study    OTHER DIAGNOSTICS  Personal Review of other prior diagnostics is notable for:   Routine EEG November 12, 2022    ELECTROENCEPHALOGRAM REPORT     Test Date:  November 12, 2022     Interpretation Date:  November 12, 2022     Location:  [facility]     Patient Referred By: Krystal Ables, MD     EEG Number:  [id number]        INDICATIONS FOR PROCEDURE:  He has a 38 year old man who was admitted for altered mental status.  Electrodiagnostic testing ordered for concern of possible seizure activity prior to arrival.     Medications:   acetaminophen  (TYLENOL ) tablet, 650 mg, Oral, Q4H PRN  alcohol  62 % (NOZIN NASAL SANITIZER) nasal solution, 1 Each, Each  Nostril, 2x/day  heparin  5,000 unit/mL injection, 5,000 Units, Subcutaneous, Q8HRS  naloxone  (NARCAN ) 4 mg in NS 250 mL infusion, 0.25 mg/hr, Intravenous, Continuous  NS flush syringe, 3 mL, Intracatheter, Q8HRS  NS flush syringe, 3 mL, Intracatheter, Q1H PRN  NS premix infusion, , Intravenous, Continuous  ondansetron  (ZOFRAN ) 2 mg/mL injection, 4 mg, Intravenous, Q6H PRN           DESCRIPTION OF PROCEDURE: Electrodes were applied using paste technique in positions dictated by the International 10-20 system of placement. Recording montages included both referential and bipolar derivations. In addition to EEG data, EKG was recorded.     This is a routine EEG recorded on November 12, 2022, running from 08:32:51 until 09:07:22 for 30 minutes and 15 seconds of data.     DESCRIPTION OF ACTIVITIES: At the onset of the recording, the patient is unresponsive with eyes closed on the stretcher.  There is no clear posterior dominant rhythm.  There is no anterior to posterior gradient throughout most of the recording.  There is profound generalized slowing.  There are intermittent generalized discharges of triphasic morphology.     No clear sleep architecture is seen.     Photic stimulation does not produce a driving response or epileptiform activation. Hyperventilation is not performed.     No epileptiform discharges or seizures are seen.     A single channel EKG shows normal sinus rhythm.     CLINICAL INTERPRETATION: This routine length EEG with video is abnormal due to disorganization of the background, generalized slowing and intermittent discharges of triphasic morphology.  This is indicative of moderate to severe bihemispheric  dysfunction. While nonspecific, triphasic waves are commonly seen in hepatic encephalopathy. No epileptiform discharges or seizures are seen.  Clinical correlation is advised.
